# Patient Record
Sex: Female | Born: 1935 | Race: White | Hispanic: No | Smoking: Former smoker
Health system: Southern US, Community
[De-identification: ages and names within clinical notes are randomized; demographics above are authoritative.]

## PROBLEM LIST (undated history)

## (undated) DIAGNOSIS — E739 Lactose intolerance, unspecified: Secondary | ICD-10-CM

## (undated) DIAGNOSIS — Z860100 Personal history of colon polyps, unspecified: Secondary | ICD-10-CM

## (undated) DIAGNOSIS — I00 Rheumatic fever without heart involvement: Secondary | ICD-10-CM

## (undated) DIAGNOSIS — K589 Irritable bowel syndrome without diarrhea: Secondary | ICD-10-CM

## (undated) DIAGNOSIS — J8410 Pulmonary fibrosis, unspecified: Secondary | ICD-10-CM

## (undated) DIAGNOSIS — I4891 Unspecified atrial fibrillation: Secondary | ICD-10-CM

## (undated) DIAGNOSIS — E559 Vitamin D deficiency, unspecified: Secondary | ICD-10-CM

## (undated) DIAGNOSIS — E785 Hyperlipidemia, unspecified: Secondary | ICD-10-CM

## (undated) DIAGNOSIS — M1990 Unspecified osteoarthritis, unspecified site: Secondary | ICD-10-CM

## (undated) DIAGNOSIS — I10 Essential (primary) hypertension: Secondary | ICD-10-CM

## (undated) HISTORY — DX: Personal history of colonic polyps: Z86.010

## (undated) HISTORY — PX: TONSILLECTOMY: SUR1361

## (undated) HISTORY — DX: Lactose intolerance, unspecified: E73.9

## (undated) HISTORY — DX: Unspecified osteoarthritis, unspecified site: M19.90

## (undated) HISTORY — DX: Hyperlipidemia, unspecified: E78.5

## (undated) HISTORY — DX: Vitamin D deficiency, unspecified: E55.9

## (undated) HISTORY — PX: CATARACT EXTRACTION: SUR2

## (undated) HISTORY — DX: Irritable bowel syndrome, unspecified: K58.9

## (undated) HISTORY — PX: APPENDECTOMY: SHX54

## (undated) HISTORY — DX: Pulmonary fibrosis, unspecified: J84.10

## (undated) HISTORY — DX: Rheumatic fever without heart involvement: I00

## (undated) HISTORY — PX: OVARIAN CYST SURGERY: SHX726

## (undated) HISTORY — DX: Personal history of colon polyps, unspecified: Z86.0100

## (undated) HISTORY — DX: Essential (primary) hypertension: I10

## (undated) HISTORY — DX: Unspecified atrial fibrillation: I48.91

---

## 2009-11-02 ENCOUNTER — Encounter: Payer: Self-pay | Admitting: Cardiology

## 2009-11-07 ENCOUNTER — Encounter: Payer: Self-pay | Admitting: Cardiology

## 2009-11-24 ENCOUNTER — Encounter: Payer: Self-pay | Admitting: Cardiology

## 2009-12-28 ENCOUNTER — Encounter: Payer: Self-pay | Admitting: Cardiology

## 2009-12-28 ENCOUNTER — Ambulatory Visit: Payer: Self-pay | Admitting: Cardiology

## 2010-01-02 ENCOUNTER — Encounter: Payer: Self-pay | Admitting: Cardiology

## 2010-01-20 ENCOUNTER — Encounter: Payer: Self-pay | Admitting: Cardiology

## 2010-02-13 ENCOUNTER — Ambulatory Visit: Payer: Self-pay | Admitting: Cardiology

## 2010-02-13 DIAGNOSIS — I7789 Other specified disorders of arteries and arterioles: Secondary | ICD-10-CM

## 2010-02-13 DIAGNOSIS — R55 Syncope and collapse: Secondary | ICD-10-CM | POA: Insufficient documentation

## 2010-02-13 DIAGNOSIS — R002 Palpitations: Secondary | ICD-10-CM

## 2010-02-13 DIAGNOSIS — R0602 Shortness of breath: Secondary | ICD-10-CM | POA: Insufficient documentation

## 2010-02-13 DIAGNOSIS — E119 Type 2 diabetes mellitus without complications: Secondary | ICD-10-CM | POA: Insufficient documentation

## 2010-02-13 DIAGNOSIS — I10 Essential (primary) hypertension: Secondary | ICD-10-CM

## 2010-02-13 DIAGNOSIS — E278 Other specified disorders of adrenal gland: Secondary | ICD-10-CM | POA: Insufficient documentation

## 2010-02-13 DIAGNOSIS — R072 Precordial pain: Secondary | ICD-10-CM | POA: Insufficient documentation

## 2010-02-28 ENCOUNTER — Encounter: Payer: Self-pay | Admitting: Cardiology

## 2010-05-03 ENCOUNTER — Encounter: Payer: Self-pay | Admitting: Cardiology

## 2010-06-07 ENCOUNTER — Encounter: Payer: Self-pay | Admitting: Cardiology

## 2010-06-12 ENCOUNTER — Ambulatory Visit: Payer: Self-pay | Admitting: Cardiology

## 2010-06-13 ENCOUNTER — Telehealth (INDEPENDENT_AMBULATORY_CARE_PROVIDER_SITE_OTHER): Payer: Self-pay | Admitting: *Deleted

## 2010-09-26 NOTE — Assessment & Plan Note (Signed)
Summary: 4 MO FU PER OCT REMINDER   Visit Type:  Follow-up Primary Provider:  [name],MD  CC:  Dyspnea.  History of Present Illness: The patient returns for followup of her dyspnea. At the last visit I thought she had some mild diastolic dysfunction and very mild systolic dysfunction but did not fully explain her dyspnea. She also had crackles on lung exam. She has since seen Dr. Orson Aloe. Pulmonary function testing demonstrated only a mild diffusion abnormality. He thought her 6 minute walk test was actually reasonable though she apparently desaturated at the end. He did order a CT which I reviewed today. This demonstrates some bronchiectasis and apparent pulmonary fibrosis. The patient however has actually been feeling better. She's been doing exercises and slowly has improved. She is able to take a deep breath and has less cough weakness. She is not describing any chest pressure, neck or arm discomfort. She is not describing any palpitations, presyncope or syncope. She is not describing PND or orthopnea.   Preventive Screening-Counseling & Management  Alcohol-Tobacco     Smoking Status: quit     Year Quit: 1974  Current Medications (verified): 1)  Losartan Potassium 100 Mg Tabs (Losartan Potassium) .... Take 1 Tablet By Mouth Once A Day 2)  Metformin Hcl 500 Mg Tabs (Metformin Hcl) .... Take 1 Tablet By Mouth Twice A Day 3)  Hydrochlorothiazide 12.5 Mg Tabs (Hydrochlorothiazide) .... Take 1 Tablet By Mouth Once A Day 4)  Aspir-Low 81 Mg Tbec (Aspirin) .... Take 1 Tablet By Mouth Once A Day 5)  Crestor 10 Mg Tabs (Rosuvastatin Calcium) .... Take 1 Tablet By Mouth Once A Day 6)  Glucosamine 1500 Complex  Caps (Glucosamine-Chondroit-Vit C-Mn) .... Take 2 Tablet By Mouth Once A Day 7)  Coq10 30 Mg Caps (Coenzyme Q10) .... Take 1 Tablet By Mouth Once A Day 8)  Eq Chlortabs 4 Mg Tabs (Chlorpheniramine Maleate) .... Take 1 Tablet By Mouth Once A Day As Needed 9)  Amlodipine Besylate  2.5 Mg Tabs (Amlodipine Besylate) .... Take One Tablet By Mouth Daily 10)  Mucinex 600 Mg Xr12h-Tab (Guaifenesin) .... Take 1 Tablet By Mouth Once A Day 11)  Aleve 220 Mg Tabs (Naproxen Sodium) .... As Needed 12)  Selenium 200 Mcg Tabs (Selenium) .... Take One By Mouth Twice Weekly 13)  Ra Turmeric 500 Mg Caps (Turmeric) .... As Needed 14)  Magnesium 250 Mg Tabs (Magnesium) .... Take One By Mouth Weekly 15)  Super Cal-Mag-D [phone] Mg-Mg-Unit Tabs (Calcium-Magnesium-Vitamin D) .... Take 1 Tablet By Mouth Two Times A Day 16)  Rhodiola 300 Mg Caps (Rhodiola Rosea) .... Take One By Mouth Twice Weekly 17)  Vitamin D3 2000 Unit Tabs (Cholecalciferol) .... Take 1 Tablet By Mouth Once A Day  Allergies: 1)  ! * ?pain Medication  Comments:  Nurse/Medical Assistant: The patient's medication bottles and allergies were reviewed with the patient and were updated in the Medication and Allergy Lists.  Past History:  Past Medical History: Reviewed history from 02/13/2010 and no changes required. Presyncopal episodes DM x 12 years HTN x years Hyperlipidemia x 10 years VIT D Insuff s/p replacement OA with lumbar stenosis Lactose intolerance Rheumatic fever Mildly reduced ejection fraction processes echocardiography 12/2009)  Past Surgical History: Reviewed history from 02/13/2010 and no changes required. Ovarian cyst resected Appendectomy Tonsillectomy Cataract surgery  Review of Systems       As stated in the HPI and negative for all other systems.     Vital Signs:  Patient profile:  77 year old female Height:      65 inches Weight:      202 pounds Pulse rate:   83 / minute BP sitting:   129 / 87  (left arm) Cuff size:   large  Vitals Entered By: Carlye Grippe (June 12, 2010 3:45 PM)  Physical Exam  General:  Well developed, well nourished, in no acute distress. Head:  normocephalic and atraumatic Eyes:  PERRLA/EOM intact; conjunctiva and lids normal. Neck:  Neck  supple, no JVD. No masses, thyromegaly or abnormal cervical nodes. Lungs:  Bilateral basilar crackles one third of the way up Abdomen:  Bowel sounds positive; abdomen soft and non-tender without masses, organomegaly, or hernias noted. No hepatosplenomegaly. Msk:  Back normal, normal gait. Muscle strength and tone normal. Extremities:  Bilateral varicose veins, with mild bilateral edema Neurologic:  Alert and oriented x 3. Skin:  Intact without lesions or rashes. Cervical Nodes:  no significant adenopathy Psych:  Normal affect.   Detailed Cardiovascular Exam  Neck    Carotids: Carotids full and equal bilaterally soft bilateral bruits.      Neck Veins: Normal, no JVD.    Heart    Inspection: no deformities or lifts noted.      Palpation: normal PMI with no thrills palpable.      Auscultation: irregular rate and rhythm, S1, S2 without murmurs, rubs, gallops, or clicks.    Vascular    Abdominal Aorta: no palpable masses, pulsations, or audible bruits.      Femoral Pulses: normal femoral pulses bilaterally.      Pedal Pulses: normal pedal pulses bilaterally.  absent right dorsalis pedis pulse and absent left dorsalis pedis pulse.      Radial Pulses: normal radial pulses bilaterally.      Peripheral Circulation: no clubbing, cyanosis, or edema noted with normal capillary refill.     Impression & Recommendations:  Problem # 1:  DYSPNEA (ICD-786.05) The patient probably has some component of her dyspnea related to mild diastolic and systolic dysfunction. We discussed conservative therapies for management of his blood pressure control salt and fluid restriction. She does have dry crackles on exam and pulmonary fibrosis on her CT. I have moved up her appointment to see the pulmonologist in followup for followup of this CT finding. She will continue her rehabilitation/exercise as she is improving.  Problem # 2:  HYPERTENSION (ICD-401.9) Her blood pressure is controlled. She will continue the  meds as listed.  Problem # 3:  PRECORDIAL PAIN (ICD-786.51) At this point no further cardiovascular testing is suggested.C. with risk reduction and meds as listed.  Patient Instructions: 1)  Your physician wants you to follow-up in: 1 year. You will receive a reminder letter in the mail one-two months in advance. If you don't receive a letter, please call our office to schedule the follow-up appointment. 2)  Your physician recommends that you continue on your current medications as directed. Please refer to the Current Medication list given to you today.

## 2010-09-26 NOTE — Assessment & Plan Note (Signed)
Summary: NP- NONDILATED CARDIOMYOPATHY   Visit Type:  Initial Consult Primary Provider:  Youlanda Roys  CC:  Abnormal Nuclear Test.  History of Present Illness: The patient presents for evaluation of an abnormal echocardiogram. The patient has had dyspnea. She was living in [place] in 2009 and had a workup for evaluation of this and pain between her shoulder blades. A stress perfusion study then demonstrated no ischemia or infarct. I reviewed this. She has moved down here and has continued to have slowly progressive dyspnea. Should get short of breath if she has to walk quickly on level ground. She can do household chores" her own pace" and not be dyspneic. She does not describe PND or orthopnea. She has had 2 episodes of discomfort similar to that which she had 2 years ago. This is between her shoulder blades. It is a dull cramping feeling that comes on at night at rest. It does not radiate. There are no associated symptoms. It is moderate in intensity and comes on spontaneously and goes away the same way. She cannot bring this on.  For evaluation of this her primary physician did water an echocardiogram which suggested that her ejection fraction was 45-50%. There was mild aortic valve sclerosis. There was mild aortic regurgitation. There was a mildly elevated descending aorta. Stress perfusion imaging on 54 however suggested the EES to be about 55% with questionable small apical defect consistent with apical thinning versus ischemia.  New Orders:     1)  EKG w/ Interpretation (93000)     2)  T-BNP  (B Natriuretic Peptide) ([id number])   Preventive Screening-Counseling & Management  Alcohol-Tobacco     Smoking Status: quit     Year Quit: 1970  Current Medications (verified): 1)  Atacand 32 Mg Tabs (Candesartan Cilexetil) .... Take 1 Tablet By Mouth Once A Day 2)  Metformin Hcl 500 Mg Tabs (Metformin Hcl) .... Take 1 Tablet By Mouth Twice A Day 3)  Hydrochlorothiazide 12.5 Mg Tabs  (Hydrochlorothiazide) .... Take 1 Tablet By Mouth Once A Day 4)  Aspir-Low 81 Mg Tbec (Aspirin) .... Take 1 Tablet By Mouth Once A Day 5)  Crestor 10 Mg Tabs (Rosuvastatin Calcium) .... Take 1 Tablet By Mouth Once A Day 6)  Glucosamine 1500 Complex  Caps (Glucosamine-Chondroit-Vit C-Mn) .... Take 2 Tablet By Mouth Once A Day 7)  Coq10 30 Mg Caps (Coenzyme Q10) .... Take 1 Tablet By Mouth Once A Day 8)  Eq Chlortabs 4 Mg Tabs (Chlorpheniramine Maleate) .... Take 1 Tablet By Mouth Once A Day 9)  Multivitamins  Tabs (Multiple Vitamin) .... Take 1 Tablet By Mouth Once A Day  Allergies (verified): 1)  ! * ?pain Medication  Comments:  Nurse/Medical Assistant: The patient's medication bottles and allergies were reviewed with the patient and were updated in the Medication and Allergy Lists.  Past History:  Past Medical History: Presyncopal episodes DM x 12 years HTN x years Hyperlipidemia x 10 years VIT D Insuff s/p replacement OA with lumbar stenosis Lactose intolerance Rheumatic fever Mildly reduced ejection fraction processes echocardiography 12/2009)  Past Surgical History: Ovarian cyst resected Appendectomy Tonsillectomy Cataract surgery  Family History: Father died CVA age 38, Htn Mother died age 24 breast CA Brothers and sisters with "Heart Problems"  Social History: Widow x 2  2 Children Quit tobacco remotely briefly Smoking Status:  quit  Vital Signs:  Patient profile:   77 year old female Height:      65 inches Weight:  200 pounds BMI:     33.40 Pulse rate:   66 / minute BP sitting:   156 / 97  (left arm) Cuff size:   regular  Vitals Entered By: Carlye Grippe (February 13, 2010 12:56 PM)  Nutrition Counseling: Patient's BMI is greater than 25 and therefore counseled on weight management options. CC: Abnormal Nuclear Test   Physical Exam  General:  Well developed, well nourished, in no acute distress. Head:  normocephalic and atraumatic Eyes:   PERRLA/EOM intact; conjunctiva and lids normal. Mouth:  Upper dentures. Oral mucosa normal. Neck:  Neck supple, no JVD. No masses, thyromegaly or abnormal cervical nodes. Chest Wall:  no deformities or breast masses noted Lungs:  Clear bilaterally to auscultation and percussion. Abdomen:  Bowel sounds positive; abdomen soft and non-tender without masses, organomegaly, or hernias noted. No hepatosplenomegaly. Msk:  Back normal, normal gait. Muscle strength and tone normal. Extremities:  Bilateral varicose veins Neurologic:  Alert and oriented x 3. Skin:  Intact without lesions or rashes. Cervical Nodes:  no significant adenopathy Axillary Nodes:  no significant adenopathy Inguinal Nodes:  no significant adenopathy Psych:  Normal affect.   Detailed Cardiovascular Exam  Neck    Carotids: Carotids full and equal bilaterally soft bilateral bruits.      Neck Veins: Normal, no JVD.    Heart    Inspection: no deformities or lifts noted.      Palpation: normal PMI with no thrills palpable.      Auscultation: irregular rate and rhythm, S1, S2 without murmurs, rubs, gallops, or clicks.    Vascular    Abdominal Aorta: no palpable masses, pulsations, or audible bruits.      Femoral Pulses: normal femoral pulses bilaterally.      Pedal Pulses: normal pedal pulses bilaterally.  absent right dorsalis pedis pulse and absent left dorsalis pedis pulse.      Radial Pulses: normal radial pulses bilaterally.      Peripheral Circulation: no clubbing, cyanosis, or edema noted with normal capillary refill.     EKG  Procedure date:  02/13/2010  Findings:      sinus rhythm, rate 64, axis within normal limits, , intervals within normal limits, premature atrial contractions, poor anterior R-wave progression  Impression & Recommendations:  Problem # 1:  DYSPNEA (ICD-786.05)  The patient has slowly progressive dyspnea. I suspect that her ejection fraction is low normal based on the 2 studies reviewed.  It's unclear whether this could be contributing to her symptoms. In this situation BNP testing would be helpful. If this is normal I would suggest a pulmonary workup rather than further cardiac workup. I would suggest a followup echocardiogram in one year to reassess the mild abnormalities demonstrated.  Orders: T-BNP  (B Natriuretic Peptide) ([id number])  Problem # 2:  HYPERTENSION (ICD-401.9) Her blood pressure is not controlled. She reports it is in the 150s at home.  I will add amlodipine 2.5 mg daily.  Problem # 3:  PALPITATIONS (ICD-785.1)  She has PACs but I don't suspect significant dysrhythmia. No further evaluation is warranted.  Orders: EKG w/ Interpretation (93000)  Patient Instructions: 1)  Your physician recommends that you go to the [facility] for lab work. 2)  Start amlodipine (Norvasc) 2.5mg  by mouth once daily. 3)  Your physician wants you to follow-up in: 4 months. You will receive a reminder letter in the mail one-two months in advance. If you don't receive a letter, please call our office to schedule the follow-up appointment. Prescriptions: AMLODIPINE  BESYLATE 2.5 MG TABS (AMLODIPINE BESYLATE) Take one tablet by mouth daily  #30 x 6   Entered by:   Cyril Loosen, RN, BSN   Authorized by:   Rollene Rotunda, MD, [facility]   Signed by:   Cyril Loosen, RN, BSN on 02/13/2010   Method used:   Electronically to        [facility]* (retail)       [address]       [place], [place]  [address]       Ph: [phone]       Fax: [phone]   RxID:   [id number]   Handout requested.  I have reviewed and approved all prescriptions at the time of this visit. Rollene Rotunda, MD, [facility]  February 13, 2010 1:52 PM

## 2010-09-26 NOTE — Letter (Signed)
Summary: [facility]  - OFFICE NOTE  [facility]  - OFFICE NOTE   Imported By: Claudette Laws 01/09/2010 16:30:56  _____________________________________________________________________  External Attachment:    Type:   Image     Comment:   External Document

## 2010-09-26 NOTE — Progress Notes (Signed)
Summary: Dr. Orson Aloe appt  ---- Converted from flag ---- ---- 06/12/2010 4:35 PM, Cyril Loosen, RN, BSN wrote: needs appt w/[facility] sooner. left message for them 10/17 1634 ------------------------------  Dr. Orson Aloe can see pt on 11/1 at 11:30am.   Left message to notify pt of appt.

## 2010-12-27 ENCOUNTER — Encounter: Payer: Self-pay | Admitting: Vascular Surgery

## 2012-03-11 ENCOUNTER — Encounter: Payer: Self-pay | Admitting: Cardiology

## 2012-03-12 ENCOUNTER — Encounter: Payer: Self-pay | Admitting: Internal Medicine

## 2012-03-12 LAB — COMPREHENSIVE METABOLIC PANEL
ALT: 9 U/L (ref 7–35)
Albumin/Globulin Ratio: 1.2
Albumin: 3.8
BUN: 17 mg/dL (ref 4–21)
Calcium: 9.1 mg/dL
Globulin, Total: 3.3
Glucose: 121
Sodium: 139 mmol/L (ref 137–147)
TSH: 5.92 u[IU]/mL — AB (ref 0.41–5.90)
Total Bilirubin: 0.7 mg/dL
Total Protein: 7.1 g/dL

## 2012-03-14 ENCOUNTER — Encounter: Payer: Self-pay | Admitting: Cardiology

## 2012-03-25 ENCOUNTER — Encounter: Payer: Self-pay | Admitting: Urgent Care

## 2012-03-25 ENCOUNTER — Ambulatory Visit (INDEPENDENT_AMBULATORY_CARE_PROVIDER_SITE_OTHER): Payer: Medicare PPO | Admitting: Urgent Care

## 2012-03-25 ENCOUNTER — Other Ambulatory Visit: Payer: Self-pay | Admitting: Gastroenterology

## 2012-03-25 VITALS — BP 156/91 | HR 72 | Temp 98.0°F | Ht 63.0 in | Wt 188.6 lb

## 2012-03-25 DIAGNOSIS — R197 Diarrhea, unspecified: Secondary | ICD-10-CM | POA: Insufficient documentation

## 2012-03-25 DIAGNOSIS — Z86010 Personal history of colonic polyps: Secondary | ICD-10-CM

## 2012-03-25 DIAGNOSIS — K529 Noninfective gastroenteritis and colitis, unspecified: Secondary | ICD-10-CM

## 2012-03-25 MED ORDER — PEG 3350-KCL-NA BICARB-NACL 420 G PO SOLR: ORAL | Status: AC

## 2012-03-25 NOTE — Patient Instructions (Addendum)
Colonoscopy w/ Dr Darrick Penna Stools to lab asap ALIGN daily

## 2012-03-25 NOTE — Progress Notes (Signed)
Referring Provider: Dr Kathlene November Primary Care Physician:  Juliette Alcide, MD Primary Gastroenterologist:  Dr. Jonette Eva  Chief Complaint  Patient presents with  . Irritable Bowel Syndrome    Diarrhea    HPI:  Samantha Ryan is a 78 y.o. female here as a referral from Dr. Kathlene November for IBS symptoms.  Pt states she was diagnosed w/ IBS after colonoscopy in 1999. She has diarrhea predominant symptoms.  Her symptoms have been worse over the past 3 months.  She has episodes of diarrhea 1-2 times per week.  C/o gas & bloating with cramps .  C/o postprandial urgency.  She starts to have a normal BM, which is then followed by multiple loose stools. At times, she is up 1/2 the night wiith diarrhea.  C/o nausea & cramps that are relieved w/ defecation.  Last colonoscopy 2005  In [place], [place] & she believes she had polyps.  She has tried pepto but doesn't like it because it causes constipation.  She takes an antidiarrheal green pill from [employer] & it seems to help.  Denies rectal bleeding or melena.  Weight is stable.  Appetite is good.  Denies fever, chills, new medications or recent antibiotics or foreign travel.  She did have a change w/ thyroid meds after abnormal TSH.  CMP glucose 121, otherwise normal (03/11/12) Hgb A1C 6.9 TSH 5.920  Past Medical History  Diagnosis Date  . Pre-syncope   . Diabetes mellitus   . Hypertension   . Hyperlipidemia   . Vitamin D insufficiency   . OA (osteoarthritis)   . Lactose intolerance   . Rheumatic fever   . Pulmonary fibrosis   . IBS (irritable bowel syndrome)     Past Surgical History  Procedure Date  . Ovarian cyst surgery   . Appendectomy   . Tonsillectomy   . Cataract extraction     Current Outpatient Prescriptions  Medication Sig Dispense Refill  . amLODipine (NORVASC) 2.5 MG tablet Take 2.5 mg by mouth daily.      Marland Kitchen aspirin 81 MG tablet Take 81 mg by mouth daily.      . chlorpheniramine (CHLOR-TRIMETON) 4 MG tablet Take 4 mg by mouth 2 (two)  times daily as needed.      . Cholecalciferol (VITAMIN D-3 PO) Take 1,000 Units by mouth daily.       . Coenzyme Q10 (COQ10 PO) Take by mouth.      . fish oil-omega-3 fatty acids 1000 MG capsule Take 2 g by mouth daily.      Marland Kitchen guaiFENesin (MUCINEX) 600 MG 12 hr tablet Take 1,200 mg by mouth daily.      . hydrochlorothiazide (HYDRODIURIL) 25 MG tablet Take 25 mg by mouth daily.       Marland Kitchen levothyroxine (SYNTHROID, LEVOTHROID) 25 MCG tablet Take 25 mcg by mouth daily.       Marland Kitchen losartan (COZAAR) 100 MG tablet Take 100 mg by mouth daily.      . magnesium oxide (MAG-OX) 400 MG tablet Take 400 mg by mouth daily.      . metFORMIN (GLUCOPHAGE) 500 MG tablet Take 500 mg by mouth 2 (two) times daily with a meal.      . psyllium (METAMUCIL) 58.6 % powder Take 1 packet by mouth daily.      . Red Yeast Rice 600 MG TABS Take 600 mg by mouth daily.      Marland Kitchen zolpidem (AMBIEN) 5 MG tablet Take 5 mg by mouth at bedtime as needed.       Marland Kitchen  polyethylene glycol-electrolytes (TRILYTE) 420 G solution Use as directed Also buy 1 fleet enema & 4 dulcolax tablets to use as directed  4000 mL  0  . Selenium 200 MCG CAPS Take by mouth.      . Turmeric 500 MG CAPS Take by mouth.        Allergies as of 03/25/2012  . (No Known Allergies)    Family History:There is no known family history of colorectal carcinoma , liver disease, or inflammatory bowel disease.  Problem Relation Age of Onset  . Stroke Father 41    Cause of Death  . Hypertension Father   . Breast cancer Mother 25    Cause of death    History   Social History  . Marital Status: Widowed    Spouse Name: N/A    Number of Children: 2  . Years of Education: N/A   Occupational History  . Not on file.   Social History Main Topics  . Smoking status: Former Games developer  . Smokeless tobacco: Not on file  . Alcohol Use: No  . Drug Use: No  . Sexually Active: Not on file   Review of Systems: Gen: Denies any fever, chills, sweats, anorexia, fatigue, weakness,  malaise, weight loss, and sleep disorder CV: Denies chest pain, angina, palpitations, syncope, orthopnea, PND, peripheral edema, and claudication. Resp: Denies dyspnea at rest, dyspnea with exercise, cough, sputum, wheezing, coughing up blood, and pleurisy. GI: Denies vomiting blood, jaundice, and fecal incontinence.   Denies dysphagia or odynophagia. GU : Denies urinary burning, blood in urine, urinary frequency, urinary hesitancy, nocturnal urination, and urinary incontinence. MS: Denies joint pain, limitation of movement, and swelling, stiffness, low back pain, extremity pain. Denies muscle weakness, cramps, atrophy.  Derm: Denies rash, itching, dry skin, hives, moles, warts, or unhealing ulcers.  Psych: Denies depression, anxiety, memory loss, suicidal ideation, hallucinations, paranoia, and confusion. Heme: Denies bruising, bleeding, and enlarged lymph nodes. Neuro:  Denies any headaches, dizziness, paresthesias. Endo:  Denies any problems with DM, thyroid, adrenal function.  Physical Exam: BP 156/91  Pulse 72  Temp 98 F (36.7 C) (Temporal)  Ht 5\' 3"  (1.6 m)  Wt 188 lb 9.6 oz (85.548 kg)  BMI 33.41 kg/m2 General:   Alert,  Well-developed, well-nourished, pleasant and cooperative in NAD Head:  Normocephalic and atraumatic. Eyes:  Sclera clear, no icterus.   Conjunctiva pink. Ears:  Normal auditory acuity. Nose:  No deformity, discharge, or lesions. Mouth:  No deformity or lesions,oropharynx pink & moist. Neck:  Supple; no masses or thyromegaly. Lungs:  Clear throughout to auscultation.   No wheezes, crackles, or rhonchi. No acute distress. Heart:  Regular rate and rhythm; no murmurs, clicks, rubs,  or gallops. Abdomen:  Normal bowel sounds.  No bruits.  Soft, non-tender and non-distended without masses, hepatosplenomegaly or hernias noted.  No guarding or rebound tenderness.   Rectal:  Deferred. Msk:  Symmetrical without gross deformities. Normal posture. Pulses:  Normal pulses  noted. Extremities:  No edema. Neurologic:  Alert and oriented x4;  grossly normal neurologically. Skin:  Intact without significant lesions or rashes. Lymph Nodes:  No significant cervical adenopathy. Psych:  Alert and cooperative. Normal mood and affect.'

## 2012-03-26 ENCOUNTER — Encounter: Payer: Self-pay | Admitting: Urgent Care

## 2012-03-26 DIAGNOSIS — Z86010 Personal history of colonic polyps: Secondary | ICD-10-CM | POA: Insufficient documentation

## 2012-03-26 NOTE — Progress Notes (Signed)
Faxed to PCP

## 2012-03-26 NOTE — Assessment & Plan Note (Addendum)
Samantha Ryan is a pleasant 78 y.o. female with hx IBS-D.  Over past 3 months, worsening diarrhea, especially nocturnal, with urgency & abdominal pain.  Differentials include microscopic colitis, colorectal ca, diabetic enteropathy or SBBO, side effect from change in synthroid, acute infectious process or IBS-flare.  Ileocolonoscopy w/ random biopsies if needed with Dr Darrick Penna.  I have discussed risks & benefits which include, but are not limited to, bleeding, infection, perforation & drug reaction.  The patient agrees with this plan & written consent will be obtained.    Full set stool studies ALIGN daily

## 2012-03-31 ENCOUNTER — Encounter: Payer: Self-pay | Admitting: Cardiology

## 2012-03-31 ENCOUNTER — Ambulatory Visit (INDEPENDENT_AMBULATORY_CARE_PROVIDER_SITE_OTHER): Payer: Medicare PPO | Admitting: Cardiology

## 2012-03-31 VITALS — BP 150/90 | HR 74 | Resp 16 | Ht 63.0 in | Wt 186.0 lb

## 2012-03-31 DIAGNOSIS — I4891 Unspecified atrial fibrillation: Secondary | ICD-10-CM

## 2012-03-31 MED ORDER — AMLODIPINE BESYLATE 5 MG PO TABS: 5.0000 mg | ORAL_TABLET | Freq: Every day | ORAL | Status: AC

## 2012-03-31 NOTE — Patient Instructions (Addendum)
Atrial Fibrillation Your caregiver has diagnosed you with atrial fibrillation (AFib). The heart normally beats very regularly; AFib is a type of irregular heartbeat. The heart rate may be faster or slower than normal. This can prevent your heart from pumping as well as it should. AFib can be constant (chronic) or intermittent (paroxysmal). CAUSES  Atrial fibrillation may be caused by:  Heart disease, including heart attack, coronary artery disease, heart failure, diseases of the heart valves, and others.   Blood clot in the lungs (pulmonary embolism).   Pneumonia or other infections.   Chronic lung disease.   Thyroid disease.   Toxins. These include alcohol, some medications (such as decongestant medications or diet pills), and caffeine.  In some people, no cause for AFib can be found. This is referred to as Lone Atrial Fibrillation. SYMPTOMS   Palpitations or a fluttering in your chest.   A vague sense of chest discomfort.   Shortness of breath.   Sudden onset of lightheadedness or weakness.  Sometimes, the first sign of AFib can be a complication of the condition. This could be a stroke or heart failure. DIAGNOSIS  Your description of your condition may make your caregiver suspicious of atrial fibrillation. Your caregiver will examine your pulse to determine if fibrillation is present. An EKG (electrocardiogram) will confirm the diagnosis. Further testing may help determine what caused you to have atrial fibrillation. This may include chest x-ray, echocardiogram, blood tests, or CT scans. PREVENTION  If you have previously had atrial fibrillation, your caregiver may advise you to avoid substances known to cause the condition (such as stimulant medications, and possibly caffeine or alcohol). You may be advised to use medications to prevent recurrence. Proper treatment of any underlying condition is important to help prevent recurrence. PROGNOSIS  Atrial fibrillation does tend  to become a chronic condition over time. It can cause significant complications (see below). Atrial fibrillation is not usually immediately life-threatening, but it can shorten your life expectancy. This seems to be worse in women. If you have lone atrial fibrillation and are under 4 years old, the risk of complications is very low, and life expectancy is not shortened. RISKS AND COMPLICATIONS  Complications of atrial fibrillation can include stroke, chest pain, and heart failure. Your caregiver will recommend treatments for the atrial fibrillation, as well as for any underlying conditions, to help minimize risk of complications. TREATMENT  Treatment for AFib is divided into several categories:  Treatment of any underlying condition.   Converting you out of AFib into a regular (sinus) rhythm.   Controlling rapid heart rate.   Prevention of blood clots and stroke.  Medications and procedures are available to convert your atrial fibrillation to sinus rhythm. However, recent studies have shown that this may not offer you any advantage, and cardiac experts are continuing research and debate on this topic. More important is controlling your rapid heartbeat. The rapid heartbeat causes more symptoms, and places strain on your heart. Your caregiver will advise you on the use of medications that can control your heart rate. Atrial fibrillation is a strong stroke risk. You can lessen this risk by taking blood thinning medications such as Coumadin (warfarin), or sometimes aspirin. These medications need close monitoring by your caregiver. Over-medication can cause bleeding. Too little medication may not protect against stroke. HOME CARE INSTRUCTIONS   If your caregiver prescribed medicine to make your heartbeat more normally, take as directed.   If blood thinners were prescribed by your caregiver, take EXACTLY  as directed.   Perform blood tests EXACTLY as directed.   Quit smoking. Smoking increases  your cardiac and lung (pulmonary) risks.   DO NOT drink alcohol.   DO NOT drink caffeinated drinks (e.g. coffee, soda, chocolate, and leaf teas). You may drink decaffeinated coffee, soda or tea.   If you are overweight, you should choose a reduced calorie diet to lose weight. Please see a registered dietitian if you need more information about healthy weight loss. DO NOT USE DIET PILLS as they may aggravate heart problems.   If you have other heart problems that are causing AFib, you may need to eat a low salt, fat, and cholesterol diet. Your caregiver will tell you if this is necessary.   Exercise every day to improve your physical fitness. Stay active unless advised otherwise.   If your caregiver has given you a follow-up appointment, it is very important to keep that appointment. Not keeping the appointment could result in heart failure or stroke. If there is any problem keeping the appointment, you must call back to this facility for assistance.  SEEK MEDICAL CARE IF:  You notice a change in the rate, rhythm or strength of your heartbeat.   You develop an infection or any other change in your overall health status.  SEEK IMMEDIATE MEDICAL CARE IF:   You develop chest pain, abdominal pain, sweating, weakness or feel sick to your stomach (nausea).   You develop shortness of breath.   You develop swollen feet and ankles.   You develop dizziness, numbness, or weakness of your face or limbs, or any change in vision or speech.  MAKE SURE YOU:   Understand these instructions.   Will watch your condition.   Will get help right away if you are not doing well or get worse.  Document Released: 08/13/2005 Document Revised: 08/02/2011 Document Reviewed: 03/17/2008 [facility] Patient Information 2012 [employer], [place].  ================================================================================   Increase Amlodipine to 5mg  daily  Echo  24 hour holter monitor   Follow up after  above

## 2012-03-31 NOTE — Addendum Note (Signed)
Addended by: Waymon Budge on: 03/31/2012 04:14 PM   Modules accepted: Orders

## 2012-03-31 NOTE — Progress Notes (Signed)
HPI The patient called to be seen for evaluation of dizziness and lightheadedness. She said this has been happening for about a year. She thinks it is getting slightly slowly worse. She does not describe any syncope. She's not describing orthostasis. She says the dizzy episodes happen if she is seated or standing but not when she is lying down. She says things will get "woozy" and she will have to steady herself. She doesn't describe any palpitations. However, she has had noted significant heart rate variations typically on the lower and when she takes her blood pressure. She denies any chest pressure, neck or arm discomfort. She's had no PND or orthopnea. However, she does get more short of breath with activities such as vacuuming or water exercises that she used to get.   I did see this patient in 2011. She had a workup for dyspnea. In 2009 she had a stress test in [place]. This demonstrated no evidence of ischemia. An echocardiogram done in 2011 demonstrated an EF of 45-50%. However, followup stress perfusion study demonstrated the EF to be 55% with mild apical thinning.  Because of ongoing dyspnea she saw Dr.Henderson.  Pulmonary function testing was only mildly abnormal there was CT demonstrated some bronchiectasis and pulmonary fibrosis.  No Known Allergies  Current Outpatient Prescriptions  Medication Sig Dispense Refill  . amLODipine (NORVASC) 5 MG tablet Take 1 tablet (5 mg total) by mouth daily.  30 tablet  6  . aspirin 81 MG tablet Take 81 mg by mouth daily.      . chlorpheniramine (CHLOR-TRIMETON) 4 MG tablet Take 4 mg by mouth 2 (two) times daily as needed.      . Cholecalciferol (VITAMIN D-3 PO) Take 1,000 Units by mouth daily.       . Coenzyme Q10 (COQ10 PO) Take by mouth.      . fish oil-omega-3 fatty acids 1000 MG capsule Take 2 g by mouth daily.      . fluticasone (FLONASE) 50 MCG/ACT nasal spray Place 2 sprays into the nose daily.      Marland Kitchen guaiFENesin (MUCINEX) 600 MG 12 hr  tablet Take 1,200 mg by mouth daily.      . hydrochlorothiazide (HYDRODIURIL) 25 MG tablet Take 25 mg by mouth daily.       Marland Kitchen HYDROcodone-acetaminophen (VICODIN) 5-500 MG per tablet Take 1 tablet by mouth every 6 (six) hours as needed.      Marland Kitchen levothyroxine (SYNTHROID, LEVOTHROID) 25 MCG tablet Take 25 mcg by mouth daily.       Marland Kitchen losartan (COZAAR) 100 MG tablet Take 100 mg by mouth daily.      . magnesium oxide (MAG-OX) 400 MG tablet Take 400 mg by mouth daily.      . metFORMIN (GLUCOPHAGE) 500 MG tablet Take 500 mg by mouth 2 (two) times daily with a meal.      . psyllium (METAMUCIL) 58.6 % powder Take 1 packet by mouth daily.      . Red Yeast Rice 600 MG TABS Take 600 mg by mouth daily.      . Selenium 200 MCG CAPS Take by mouth.      . Turmeric 500 MG CAPS Take by mouth.      . zolpidem (AMBIEN) 5 MG tablet Take 5 mg by mouth at bedtime as needed.       Marland Kitchen DISCONTD: amLODipine (NORVASC) 2.5 MG tablet Take 2.5 mg by mouth daily.        Past Medical History  Diagnosis Date  . Diabetes mellitus   . Hypertension   . Hyperlipidemia   . Vitamin D insufficiency   . OA (osteoarthritis)   . Lactose intolerance   . Rheumatic fever   . Pulmonary fibrosis   . IBS (irritable bowel syndrome)   . History of colon polyps     Past Surgical History  Procedure Date  . Ovarian cyst surgery   . Appendectomy   . Tonsillectomy   . Cataract extraction     ROS:  As stated in the HPI and negative for all other systems.  PHYSICAL EXAM BP 150/90  Pulse 74  Resp 16  Ht 5\' 3"  (1.6 m)  Wt 186 lb (84.369 kg)  BMI 32.95 kg/m2 GENERAL:  Well appearing HEENT:  Pupils equal round and reactive, fundi not visualized, oral mucosa unremarkable NECK:  No jugular venous distention, waveform within normal limits, carotid upstroke brisk and symmetric, no bruits, no thyromegaly LYMPHATICS:  No cervical, inguinal adenopathy LUNGS:  Clear to auscultation bilaterally BACK:  No CVA tenderness CHEST:   Unremarkable HEART:  PMI not displaced or sustained,S1 and S2 within normal limits, no S3, no clicks, no rubs, no murmurs, irregular  ABD:  Flat, positive bowel sounds normal in frequency in pitch, no bruits, no rebound, no guarding, no midline pulsatile mass, no hepatomegaly, no splenomegaly EXT:  2 plus pulses throughout, no edema, no cyanosis no clubbing SKIN:  No rashes no nodules NEURO:  Cranial nerves II through XII grossly intact, motor grossly intact throughout PSYCH:  Cognitively intact, oriented to person place and time   EKG:  Atrial fibrillation, rate 74, leftward axis, poor anterior R wave progression, nonspecific T-wave flattening. 03/31/2012  ASSESSMENT AND PLAN  Atrial fibrillation:  This seems to be a new finding. It may or may not be contributing to her symptoms. I will start with a 24-hour Holter monitor and an echocardiogram. Samantha Ryan has a CHA2DS2 - VASc score of 5 with a risk of stroke of 6.7%.  She will need to be on a blood thinner. I will discuss this with her at the next appointment as she does have an upcoming colonoscopy and she would need to hold anticoagulation for this.  HTN:  Her blood pressure is slightly elevated and I will increase her Norvasc to 5 mg daily.  Dyspnea:  This seems to be chronic. Evaluation will be as above.  Diabetes:  Her hemoglobin A1c was 6.9. This is being followed by Samantha Alcide, MD

## 2012-04-05 LAB — FECAL LACTOFERRIN, QUANT: Lactoferrin: NEGATIVE

## 2012-04-07 NOTE — Progress Notes (Signed)
Quick Note:  Please let pt know stool studies normal. Keep colonoscopy as planned. [id number], MD  ______

## 2012-04-08 LAB — STOOL CULTURE

## 2012-04-08 NOTE — Progress Notes (Signed)
Quick Note:  LMOM to call. ______ 

## 2012-04-09 NOTE — Progress Notes (Signed)
Quick Note:  LMOM to call. Mailed a letter also of the results and info. ______

## 2012-04-09 NOTE — Progress Notes (Signed)
Pt returned call and was given the message of the normal stool studies and to keep colonoscopy as planned.

## 2012-04-10 ENCOUNTER — Other Ambulatory Visit: Payer: Self-pay

## 2012-04-10 ENCOUNTER — Other Ambulatory Visit (INDEPENDENT_AMBULATORY_CARE_PROVIDER_SITE_OTHER): Payer: Medicare HMO

## 2012-04-10 DIAGNOSIS — I4891 Unspecified atrial fibrillation: Secondary | ICD-10-CM

## 2012-04-14 ENCOUNTER — Telehealth: Payer: Self-pay | Admitting: *Deleted

## 2012-04-14 NOTE — Telephone Encounter (Signed)
Patient informed. 

## 2012-04-14 NOTE — Telephone Encounter (Signed)
Message copied by Eustace Moore on Mon Apr 14, 2012  3:59 PM ------      Message from: Eustace Moore      Created: Mon Apr 14, 2012  3:54 PM       If not on your log, send back to me and I will notify patient            ----- Message -----         From: Eustace Moore, LPN         Sent: 04/14/2012   8:21 AM           To: Eustace Moore, LPN                        ----- Message -----         From: Rocco Serene, RN         Sent: 04/14/2012   8:20 AM           To: Lesle Chris, LPN                        ----- Message -----         From: Rollene Rotunda, MD         Sent: 04/10/2012   9:21 PM           To: Rocco Serene, RN            EF OK.  No significant valvular abnormalities.

## 2012-04-24 NOTE — Progress Notes (Signed)
Doris, Please call & give pt Dr Evelina Dun recommendations below re: lactose free diet & she may pick up from office or we can mail. Thanks

## 2012-04-24 NOTE — Progress Notes (Signed)
PT NEEDS TO BE ON A DAIRY FREE DIET UNTIL TCS.  REVIEWED.    Lactose Free Diet Lactose is a carbohydrate that is found mainly in milk and milk products, as well as in foods with added milk or whey. Lactose must be digested by the enzyme in order to be used by the body. Lactose intolerance occurs when there is a shortage of lactase. When your body is not able to digest lactose, you may feel sick to your stomach (nausea), bloating, cramping, gas and diarrhea.  There are many dairy products that may be tolerated better than milk by some people:  The use of cultured dairy products such as yogurt, buttermilk, cottage cheese, and sweet acidophilus milk (Kefir) for lactase-deficient individuals is usually well tolerated. This is because the healthy bacteria help digest lactose.   Lactose-hydrolyzed milk (Lactaid) contains 40-90% less lactose than milk and may also be well tolerated.    SPECIAL NOTES  Lactose is a carbohydrates. The major food source is dairy products. Reading food labels is important. Many products contain lactose even when they are not made from milk. Look for the following words: whey, milk solids, dry milk solids, nonfat dry milk powder. Typical sources of lactose other than dairy products include breads, candies, cold cuts, prepared and processed foods, and commercial sauces and gravies.   All foods must be prepared without milk, cream, or other dairy foods.   Soy milk and lactose-free supplements (LACTASE) may be used as an alternative to milk.   FOOD GROUP ALLOWED/RECOMMENDED AVOID/USE SPARINGLY  BREADS / STARCHES 4 servings or more* Breads and rolls made without milk. [place], [place], or [place] bread. Breads and rolls that contain milk. Prepared mixes such as muffins, biscuits, waffles, pancakes. Sweet rolls, donuts, [place] toast (if made with milk or lactose).  Crackers: Soda crackers, graham crackers. Any crackers prepared without lactose. Zwieback crackers, corn  curls, or any that contain lactose.  Cereals: Cooked or dry cereals prepared without lactose (read labels). Cooked or dry cereals prepared with lactose (read labels). Total, Cocoa Krispies. Special K.  Potatoes / Pasta / Rice: Any prepared without milk or lactose. Popcorn. Instant potatoes, frozen [place] fries, scalloped or au gratin potatoes.  VEGETABLES 2 servings or more Fresh, frozen, and canned vegetables. Creamed or breaded vegetables. Vegetables in a cheese sauce or with lactose-containing margarines.  FRUIT 2 servings or more All fresh, canned, or frozen fruits that are not processed with lactose. Any canned or frozen fruits processed with lactose.  MEAT & SUBSTITUTES 2 servings or more (4 to 6 oz. total per day) Plain beef, chicken, fish, [place], lamb, veal, pork, or ham. Kosher prepared meat products. Strained or junior meats that do not contain milk. Eggs, soy meat substitutes, nuts. Scrambled eggs, omelets, and souffles that contain milk. Creamed or breaded meat, fish, or fowl. Sausage products such as wieners, liver sausage, or cold cuts that contain milk solids. Cheese, cottage cheese, or cheese spreads.  MILK None. (See "BEVERAGES" for milk substitutes. See "DESSERTS" for ice cream and frozen desserts.) Milk (whole, 2%, skim, or chocolate). Evaporated, powdered, or condensed milk; malted milk.  SOUPS & COMBINATION FOODS Bouillon, broth, vegetable soups, clear soups, consomms. Homemade soups made with allowed ingredients. Combination or prepared foods that do not contain milk or milk products (read labels). Cream soups, chowders, commercially prepared soups containing lactose. Macaroni and cheese, pizza. Combination or prepared foods that contain milk or milk products.  DESSERTS & SWEETS In moderation Water and fruit ices; gelatin; angel  food cake. Homemade cookies, pies, or cakes made from allowed ingredients. Pudding (if made with water or a milk substitute). Lactose-free tofu  desserts. Sugar, honey, corn syrup, jam, jelly; marmalade; molasses (beet sugar); Pure sugar candy; marshmallows. Ice cream, ice milk, sherbet, custard, pudding, frozen yogurt. Commercial cake and cookie mixes. Desserts that contain chocolate. Pie crust made with milk-containing margarine; reduced-calorie desserts made with a sugar substitute that contains lactose. Toffee, peppermint, butterscotch, chocolate, caramels.  FATS & OILS In moderation Butter (as tolerated; contains very small amounts of lactose). Margarines and dressings that do not contain milk, Vegetable oils, shortening, Miracle Whip, mayonnaise, nondairy cream & whipped toppings without lactose or milk solids added (examples: Coffee Rich, Carnation Coffeemate, Rich's Whipped Topping, PolyRich). Tomasa Blase. Margarines and salad dressings containing milk; cream, cream cheese; peanut butter with added milk solids, sour cream, chip dips, made with sour cream.  BEVERAGES Carbonated drinks; tea; coffee and freeze-dried coffee; some instant coffees (check labels). Fruit drinks; fruit and vegetable juice; Rice or Soy milk. Ovaltine, hot chocolate. Some cocoas; some instant coffees; instant iced teas; powdered fruit drinks (read labels).   CONDIMENTS / MISCELLANEOUS Soy sauce, carob powder, olives, gravy made with water, baker's cocoa, pickles, pure seasonings and spices, wine, pure monosodium glutamate, catsup, mustard. Some chewing gums, chocolate, some cocoas. Certain antibiotics and vitamin / mineral preparations. Spice blends if they contain milk products. MSG extender. Artificial sweeteners that contain lactose such as Equal (Nutra-Sweet) and Sweet 'n Low. Some nondairy creamers (read labels).   SAMPLE MENU*  Breakfast   Orange Juice.  Banana.   Bran flakes.   Nondairy Creamer.  Vienna Bread (toasted).   Butter or milk-free margarine.   Coffee or tea.    Noon Meal   Chicken Breast.  Rice.   Green beans.   Butter or  milk-free margarine.  Fresh melon.   Coffee or tea.    Evening Meal   Roast Beef.  Baked potato.   Butter or milk-free margarine.   Broccoli.   Lettuce salad with vinegar and oil dressing.  [employer].   Coffee or tea.

## 2012-04-24 NOTE — Progress Notes (Signed)
Called and informed pt.   She is aware that diet is in the mail.

## 2012-04-24 NOTE — Progress Notes (Signed)
LMOM for pt to call. Also, mailed the diet with a note to follow until colonoscopy.

## 2012-04-29 ENCOUNTER — Encounter (HOSPITAL_COMMUNITY): Payer: Self-pay | Admitting: Pharmacy Technician

## 2012-05-05 ENCOUNTER — Ambulatory Visit: Payer: Medicare PPO | Admitting: Cardiology

## 2012-05-12 ENCOUNTER — Encounter: Payer: Self-pay | Admitting: Cardiology

## 2012-05-12 ENCOUNTER — Ambulatory Visit (INDEPENDENT_AMBULATORY_CARE_PROVIDER_SITE_OTHER): Payer: Medicare HMO | Admitting: Cardiology

## 2012-05-12 ENCOUNTER — Telehealth: Payer: Self-pay

## 2012-05-12 VITALS — BP 127/82 | HR 62 | Ht 63.0 in | Wt 188.8 lb

## 2012-05-12 DIAGNOSIS — I4891 Unspecified atrial fibrillation: Secondary | ICD-10-CM

## 2012-05-12 MED ORDER — RIVAROXABAN 20 MG PO TABS: 20.0000 mg | ORAL_TABLET | Freq: Every day | ORAL | Status: DC

## 2012-05-12 NOTE — Progress Notes (Signed)
HPI  The patient presents for follow up of dyspnea.  I saw her recently.  I first saw her in 2011. She had a workup for dyspnea. In 2009 she had a stress test in [place]. This demonstrated no evidence of ischemia. An echocardiogram done in 2011 demonstrated an EF of 45-50%. However, followup stress perfusion study demonstrated the EF to be 55% with mild apical thinning.  Because of ongoing dyspnea she saw Dr.Henderson.  Pulmonary function testing was only mildly abnormal there was CT demonstrated some bronchiectasis and pulmonary fibrosis.  At the last appointment I ordered an echo which demonstrated preserved systolic ejection fraction with very mild aortic valve stenosis.  Holter demonstrated good control of her atrial fibrillation.  Since that visit at which time I increased her Norvasc she thinks she is doing better. She does describe some coughing when she drinks cold fluids. However, she thinks overall her breathing is better. She's not having any PND or orthopnea. She's not had any palpitations, presyncope or syncope. She has had no chest pressure, neck or arm discomfort. She has had no weight gain or edema.  Allergies  Allergen Reactions  . Lactose Intolerance (Gi)     Current Outpatient Prescriptions  Medication Sig Dispense Refill  . amLODipine (NORVASC) 5 MG tablet Take 1 tablet (5 mg total) by mouth daily.  30 tablet  6  . aspirin 81 MG tablet Take 162 mg by mouth daily.       . chlorpheniramine (CHLOR-TRIMETON) 4 MG tablet Take 4 mg by mouth 2 (two) times daily as needed. For allergies      . Cholecalciferol (VITAMIN D-3 PO) Take 1,000 Units by mouth daily.       . Coenzyme Q10 (COQ10 PO) Take 1 tablet by mouth daily.       . fish oil-omega-3 fatty acids 1000 MG capsule Take 1 g by mouth daily.       . fluticasone (FLONASE) 50 MCG/ACT nasal spray Place 2 sprays into the nose daily as needed. For allergies      . guaiFENesin (MUCINEX) 600 MG 12 hr tablet Take 1,200 mg by mouth  daily as needed. For allergies      . hydrochlorothiazide (HYDRODIURIL) 25 MG tablet Take 25 mg by mouth daily.       Marland Kitchen HYDROcodone-acetaminophen (VICODIN) 5-500 MG per tablet Take 0.5 tablets by mouth every 6 (six) hours as needed. For pain      . levothyroxine (SYNTHROID, LEVOTHROID) 25 MCG tablet Take 25 mcg by mouth daily.       Marland Kitchen losartan (COZAAR) 100 MG tablet Take 100 mg by mouth daily.      . magnesium oxide (MAG-OX) 400 MG tablet Take 400 mg by mouth every other day.       . metFORMIN (GLUCOPHAGE) 500 MG tablet Take 500 mg by mouth daily.       . psyllium (METAMUCIL) 58.6 % powder Take 1 packet by mouth daily.      . Red Yeast Rice 600 MG TABS Take 600 mg by mouth daily.      . TRILYTE 420 G solution Take 4,000 mLs by mouth once.       . Turmeric 500 MG CAPS Take 1 tablet by mouth daily.       Marland Kitchen zolpidem (AMBIEN) 5 MG tablet Take 5 mg by mouth at bedtime as needed. For sleep        Past Medical History  Diagnosis Date  . Diabetes mellitus   .  Hypertension   . Hyperlipidemia   . Vitamin D insufficiency   . OA (osteoarthritis)   . Lactose intolerance   . Rheumatic fever   . Pulmonary fibrosis   . IBS (irritable bowel syndrome)   . History of colon polyps     Past Surgical History  Procedure Date  . Ovarian cyst surgery   . Appendectomy   . Tonsillectomy   . Cataract extraction     ROS:  As stated in the HPI and negative for all other systems.  PHYSICAL EXAM BP 127/82  Pulse 62  Ht 5\' 3"  (1.6 m)  Wt 188 lb 12.8 oz (85.639 kg)  BMI 33.44 kg/m2 GENERAL:  Well appearing HEENT:  Pupils equal round and reactive, fundi not visualized, oral mucosa unremarkable NECK:  No jugular venous distention, waveform within normal limits, carotid upstroke brisk and symmetric, no bruits, no thyromegaly LYMPHATICS:  No cervical, inguinal adenopathy LUNGS:  Clear to auscultation bilaterally BACK:  No CVA tenderness CHEST:  Unremarkable HEART:  PMI not displaced or sustained,S1 and  S2 within normal limits, no S3, no clicks, no rubs, no murmurs, irregular  ABD:  Flat, positive bowel sounds normal in frequency in pitch, no bruits, no rebound, no guarding, no midline pulsatile mass, no hepatomegaly, no splenomegaly EXT:  2 plus pulses throughout, no edema, no cyanosis no clubbing SKIN:  No rashes no nodules NEURO:  Cranial nerves II through XII grossly intact, motor grossly intact throughout PSYCH:  Cognitively intact, oriented to person place and time  EKG:  Atrial fibrillation, rate 74, leftward axis, poor anterior R wave progression, nonspecific T-wave flattening. 05/12/2012  ASSESSMENT AND PLAN  Atrial fibrillation:  This seems to be a new finding. It may or may not be contributing to her symptoms. I will start with a 24-hour Holter monitor and an echocardiogram. Samantha Ryan has a CHA2DS2 - VASc score of 5 with a risk of stroke of 6.7%.  She will need to be on a blood thinner. I reviewed this at length with her today including the risks and benefits and potential risk reduction. She will start Xarelto after her upcoming colonoscopy. She is instructed to stop her aspirin.   HTN:  Her blood pressure is better controlled after her Norvasc was increased at the last visit. No change in therapy is indicated.  Dyspnea:  This seems to be chronic and improved. No change in therapy is indicated.  Diabetes:  This is being followed by Samantha Alcide, MD

## 2012-05-12 NOTE — Patient Instructions (Addendum)
Stop Aspirin.  Start Xarelto 20mg  daily with your evening meal AFTER your colonoscopy.  Your physician wants you to follow-up in: 3 months with Dr. Antoine Poche.  You will receive a reminder letter in the mail two months in advance. If you don't receive a letter, please call our office to schedule the follow-up appointment.Marland Kitchen

## 2012-05-12 NOTE — Telephone Encounter (Signed)
Pt called and needed to review instructions for her trilyte prep for her colonoscopy on 05/13/2012. I reviewed it all and she wrote down the information and said she will call if she has other questions.

## 2012-05-13 ENCOUNTER — Encounter (HOSPITAL_COMMUNITY)
Admission: RE | Disposition: A | Discharge status: Home / Self Care | Payer: Self-pay | Source: Ambulatory Visit | Attending: Gastroenterology

## 2012-05-13 ENCOUNTER — Encounter (HOSPITAL_COMMUNITY): Payer: Self-pay | Admitting: *Deleted

## 2012-05-13 ENCOUNTER — Ambulatory Visit (HOSPITAL_COMMUNITY)
Admission: RE | Admit: 2012-05-13 | Discharge: 2012-05-13 | Disposition: A | Discharge status: Home / Self Care | Payer: Medicare HMO | Source: Ambulatory Visit | Attending: Gastroenterology | Admitting: Gastroenterology

## 2012-05-13 DIAGNOSIS — R197 Diarrhea, unspecified: Secondary | ICD-10-CM | POA: Insufficient documentation

## 2012-05-13 DIAGNOSIS — E119 Type 2 diabetes mellitus without complications: Secondary | ICD-10-CM | POA: Insufficient documentation

## 2012-05-13 DIAGNOSIS — Z01812 Encounter for preprocedural laboratory examination: Secondary | ICD-10-CM | POA: Insufficient documentation

## 2012-05-13 DIAGNOSIS — E785 Hyperlipidemia, unspecified: Secondary | ICD-10-CM | POA: Insufficient documentation

## 2012-05-13 DIAGNOSIS — K529 Noninfective gastroenteritis and colitis, unspecified: Secondary | ICD-10-CM

## 2012-05-13 DIAGNOSIS — K648 Other hemorrhoids: Secondary | ICD-10-CM | POA: Insufficient documentation

## 2012-05-13 DIAGNOSIS — K5730 Diverticulosis of large intestine without perforation or abscess without bleeding: Secondary | ICD-10-CM | POA: Insufficient documentation

## 2012-05-13 DIAGNOSIS — I10 Essential (primary) hypertension: Secondary | ICD-10-CM | POA: Insufficient documentation

## 2012-05-13 HISTORY — PX: COLONOSCOPY: SHX5424

## 2012-05-13 SURGERY — COLONOSCOPY
Anesthesia: Moderate Sedation

## 2012-05-13 MED ORDER — MEPERIDINE HCL 100 MG/ML IJ SOLN
INTRAMUSCULAR | Status: AC
  Filled 2012-05-13: qty 1

## 2012-05-13 MED ORDER — MEPERIDINE HCL 100 MG/ML IJ SOLN
INTRAMUSCULAR | Status: DC | PRN
  Administered 2012-05-13 (×2): 25 mg via INTRAVENOUS

## 2012-05-13 MED ORDER — MIDAZOLAM HCL 5 MG/5ML IJ SOLN
INTRAMUSCULAR | Status: DC | PRN
  Administered 2012-05-13 (×2): 1 mg via INTRAVENOUS
  Administered 2012-05-13: 2 mg via INTRAVENOUS

## 2012-05-13 MED ORDER — SODIUM CHLORIDE 0.45 % IV SOLN
Freq: Once | INTRAVENOUS | Status: AC
  Administered 2012-05-13: 1000 mL via INTRAVENOUS

## 2012-05-13 MED ORDER — MIDAZOLAM HCL 5 MG/5ML IJ SOLN
INTRAMUSCULAR | Status: AC
  Filled 2012-05-13: qty 10

## 2012-05-13 MED ORDER — DICYCLOMINE HCL 10 MG PO CAPS: ORAL_CAPSULE | ORAL | Status: DC

## 2012-05-13 MED ORDER — STERILE WATER FOR IRRIGATION IR SOLN
Status: DC | PRN
  Administered 2012-05-13: 12:00:00

## 2012-05-13 NOTE — H&P (Signed)
Primary Care Physician:  Juliette Alcide, MD Primary Gastroenterologist:  Dr. Darrick Penna  Pre-Procedure History & Physical: HPI:  Samantha Ryan is a 78 y.o. female here for  DIARRHEA.  Past Medical History  Diagnosis Date  . Diabetes mellitus   . Hypertension   . Hyperlipidemia   . Vitamin D insufficiency   . OA (osteoarthritis)   . Lactose intolerance   . Rheumatic fever   . Pulmonary fibrosis   . IBS (irritable bowel syndrome)   . History of colon polyps     Past Surgical History  Procedure Date  . Ovarian cyst surgery   . Appendectomy   . Tonsillectomy   . Cataract extraction     Prior to Admission medications   Medication Sig Start Date End Date Taking? Authorizing Provider  amLODipine (NORVASC) 5 MG tablet Take 1 tablet (5 mg total) by mouth daily. 03/31/12  Yes Rollene Rotunda, MD  chlorpheniramine (CHLOR-TRIMETON) 4 MG tablet Take 4 mg by mouth 2 (two) times daily as needed. For allergies   Yes Historical Provider, MD  Cholecalciferol (VITAMIN D-3 PO) Take 1,000 Units by mouth daily.    Yes Historical Provider, MD  Coenzyme Q10 (COQ10 PO) Take 1 tablet by mouth daily.    Yes Historical Provider, MD  fish oil-omega-3 fatty acids 1000 MG capsule Take 1 g by mouth daily.    Yes Historical Provider, MD  fluticasone (FLONASE) 50 MCG/ACT nasal spray Place 2 sprays into the nose daily as needed. For allergies   Yes Historical Provider, MD  guaiFENesin (MUCINEX) 600 MG 12 hr tablet Take 1,200 mg by mouth daily as needed. For allergies   Yes Historical Provider, MD  hydrochlorothiazide (HYDRODIURIL) 25 MG tablet Take 25 mg by mouth daily.  03/17/12  Yes Historical Provider, MD  HYDROcodone-acetaminophen (VICODIN) 5-500 MG per tablet Take 0.5 tablets by mouth every 6 (six) hours as needed. For pain   Yes Historical Provider, MD  levothyroxine (SYNTHROID, LEVOTHROID) 25 MCG tablet Take 25 mcg by mouth daily.  03/24/12  Yes Historical Provider, MD  losartan (COZAAR) 100 MG tablet Take 100  mg by mouth daily.   Yes Historical Provider, MD  magnesium oxide (MAG-OX) 400 MG tablet Take 400 mg by mouth every other day.    Yes Historical Provider, MD  metFORMIN (GLUCOPHAGE) 500 MG tablet Take 500 mg by mouth daily.    Yes Historical Provider, MD  psyllium (METAMUCIL) 58.6 % powder Take 1 packet by mouth daily.   Yes Historical Provider, MD  Red Yeast Rice 600 MG TABS Take 600 mg by mouth daily.   Yes Historical Provider, MD  Turmeric 500 MG CAPS Take 1 tablet by mouth daily.    Yes Historical Provider, MD  zolpidem (AMBIEN) 5 MG tablet Take 5 mg by mouth at bedtime as needed. For sleep 03/21/12  Yes Historical Provider, MD  Rivaroxaban (XARELTO) 20 MG TABS Take 1 tablet (20 mg total) by mouth daily. 05/12/12   Rollene Rotunda, MD  TRILYTE 420 G solution Take 4,000 mLs by mouth once.  05/11/12   Historical Provider, MD    Allergies as of 03/25/2012  . (Not on File)    Family History  Problem Relation Age of Onset  . Stroke Father 72    Cause of Death  . Hypertension Father   . Breast cancer Mother 24    Cause of death    History   Social History  . Marital Status: Widowed    Spouse Name: N/A  Number of Children: 2  . Years of Education: N/A   Occupational History  . Not on file.   Social History Main Topics  . Smoking status: Former Games developer  . Smokeless tobacco: Not on file  . Alcohol Use: No  . Drug Use: No  . Sexually Active: Not on file   Other Topics Concern  . Not on file   Social History Narrative  . No narrative on file    Review of Systems: See HPI, otherwise negative ROS   Physical Exam: There were no vitals taken for this visit. General:   Alert,  pleasant and cooperative in NAD Head:  Normocephalic and atraumatic. Neck:  Supple; Lungs:  Clear throughout to auscultation.    Heart:  Regular rate and rhythm. Abdomen:  Soft, nontender and nondistended. Normal bowel sounds, without guarding, and without rebound.   Neurologic:  Alert and   oriented x4;  grossly normal neurologically.  Impression/Plan:     Diarrhea  PLAN: TCS TODAY WITH BIOPSY

## 2012-05-13 NOTE — Op Note (Signed)
[facility] [address] [place], [address]   COLONOSCOPY PROCEDURE REPORT  PATIENT: Samantha Ryan, Samantha Ryan  MR#: [id number] BIRTHDATE: 17-May-1931 , 81  yrs. old GENDER: Female ENDOSCOPIST: Jonette Eva, MD REFERRED AO:ZHYQMV Burdine, M.D. PROCEDURE DATE:  05/13/2012 PROCEDURE:   Colonoscopy with biopsy INDICATIONS:FORMED STOOL THEN DIARRHEA IN THE MORNING.  PMHx: IBS SINCE 1999, LACTOSE INTOLERANCE.  NOW TRYING TO FOLLOW A MORE STRICT DIET.  HAS STRESS. MEDICATIONS: Demerol 50 mg IV and Versed 4 mg IV  DESCRIPTION OF PROCEDURE:    Physical exam was performed.  Informed consent was obtained from the patient after explaining the benefits, risks, and alternatives to procedure.  The patient was connected to monitor and placed in left lateral position. Continuous oxygen was provided by nasal cannula and IV medicine administered through an indwelling cannula.  After administration of sedation and rectal exam, the patients rectum was intubated and the EC-[id number] ([id number])  colonoscope was advanced under direct visualization to the ileum.  The scope was removed slowly by carefully examining the color, texture, anatomy, and integrity mucosa on the way out.  The patient was recovered in endoscopy and discharged home in satisfactory condition.       COLON FINDINGS: The mucosa appeared normal in the terminal ileum.  10 CM VISUALIZED. , Moderate diverticulosis was noted in the sigmoid colon.  , The colon mucosa was otherwise normal. BIOPSIES OBTAINED VIA COLD FORCEPS TO EVALUATE FOR MICROSCOPIC COLITIS. and Moderate sized internal hemorrhoids were found.  PREP QUALITY: excellent. CECAL W/D TIME: 14 minutes  COMPLICATIONS: None  ENDOSCOPIC IMPRESSION: 1.   Normal mucosa in the terminal ileum 2.   Moderate diverticulosis was noted in the sigmoid colon 3.   The colon mucosa was otherwise normal 4.   Moderate sized internal hemorrhoids   RECOMMENDATIONS: 1.  await biopsy  results 2.  LACTOSE FREE/HIGH FIBER DIET BENTYL 10 MG QHS PROBIOTIC DAILY OPV IN 3 MOS        _______________________________ eSignedJonette Eva, MD 05/13/2012 12:49 PM     PATIENT NAME:  Samantha Ryan, Samantha Ryan MR#: [id number]

## 2012-05-15 ENCOUNTER — Encounter (HOSPITAL_COMMUNITY): Payer: Self-pay | Admitting: Gastroenterology

## 2012-05-19 ENCOUNTER — Telehealth: Payer: Self-pay | Admitting: Gastroenterology

## 2012-05-19 NOTE — Telephone Encounter (Signed)
Recall made 

## 2012-05-19 NOTE — Telephone Encounter (Signed)
PLEASE CALL PT.  Her colon Bx are normal. HER DIARRHEA IS DUE TO IBS & LACTOSE INTOLERANCE.   CONITNUE BENTYL 10 MG AT BEDTIME. Dicylomine can cause drowsiness, dry eyes, dry mouth, blurry vision, and difficulty urinating.   CONTINUE ALIGN DAILY. CHEAPER ALTERNATIVES ARE [employer]S COLON HEALTH OR [employer] BRAND.   FOLLOW A HIGH FIBER/DAIRY FREE DIET. AVOID ITEMS THAT CAUSE BLOATING.   FOLLOW UP IN 3 MOS.

## 2012-05-19 NOTE — Telephone Encounter (Signed)
LMOM to call.

## 2012-05-20 NOTE — Telephone Encounter (Signed)
LMOM to call.

## 2012-05-20 NOTE — Telephone Encounter (Signed)
Path faxed to PCP 

## 2012-05-21 NOTE — Telephone Encounter (Signed)
Pt returned call and was informed.  

## 2012-07-29 ENCOUNTER — Encounter: Payer: Self-pay | Admitting: Gastroenterology

## 2012-07-31 ENCOUNTER — Ambulatory Visit: Payer: Medicare HMO | Admitting: Cardiology

## 2012-08-05 ENCOUNTER — Telehealth: Payer: Self-pay | Admitting: Gastroenterology

## 2012-08-05 NOTE — Telephone Encounter (Signed)
Pt received a letter from recall list to follow up, but she said she was feeling well and didn't feel it was necessary to make OV at this time. I told her to call us if she needed to and we would make her an OV. Pt agreed.

## 2012-08-05 NOTE — Telephone Encounter (Signed)
Routing to Dr. Fields.  

## 2012-08-08 NOTE — Telephone Encounter (Signed)
REVIEWED. AGREE. 

## 2012-08-26 ENCOUNTER — Encounter: Payer: Self-pay | Admitting: Cardiology

## 2012-08-29 ENCOUNTER — Ambulatory Visit: Payer: Medicare HMO | Admitting: Cardiology

## 2012-09-16 ENCOUNTER — Ambulatory Visit: Payer: Medicare HMO | Admitting: Cardiovascular Disease

## 2012-10-20 ENCOUNTER — Ambulatory Visit (INDEPENDENT_AMBULATORY_CARE_PROVIDER_SITE_OTHER): Payer: Medicare Other | Admitting: Cardiovascular Disease

## 2012-10-20 ENCOUNTER — Encounter: Payer: Self-pay | Admitting: Cardiovascular Disease

## 2012-10-20 VITALS — BP 126/85 | HR 71 | Ht 63.0 in | Wt 189.0 lb

## 2012-10-20 DIAGNOSIS — I4891 Unspecified atrial fibrillation: Secondary | ICD-10-CM

## 2012-10-20 NOTE — Progress Notes (Signed)
HPI  The patient presents for follow up of atrial fibrillation. She was seen in 2011 by Dr. Howe Lions for dyspnea. She had a workup for dyspnea. In 2009 she had a stress test in [place]. This demonstrated no evidence of ischemia. An echocardiogram done in 2011 demonstrated an EF of 45-50%. However, followup stress perfusion study demonstrated the EF to be 55% with mild apical thinning.  Because of ongoing dyspnea she saw Dr.Henderson.  Pulmonary function testing was only mildly abnormal there was CT demonstrated some bronchiectasis and pulmonary fibrosis.  Most recent echocardiogram from last year showed preserved systolic ejection fraction with very mild aortic valve stenosis.  She was diagnosed last year with atrial fibrillation. Ventricular rate was controlled without any medication. She was started on Xarelto for anticoagulation. She is tolerating this medication. She complains of occasional nosebleeds which has been self limiting. No other signs of bleeding. She denies chest pain, palpitations or significant dyspnea.  Allergies  Allergen Reactions  . Lactose Intolerance (Gi)     Current Outpatient Prescriptions  Medication Sig Dispense Refill  . Acetylcysteine (NAC 600) 600 MG CAPS Take 1 capsule by mouth 4 (four) times daily.      Marland Kitchen amLODipine (NORVASC) 5 MG tablet Take 1 tablet (5 mg total) by mouth daily.  30 tablet  6  . chlorpheniramine (CHLOR-TRIMETON) 4 MG tablet Take 4 mg by mouth 2 (two) times daily as needed. For allergies      . Cholecalciferol (VITAMIN D-3 PO) Take 1,000 Units by mouth daily.       . Coenzyme Q10 (COQ10 PO) Take 1 tablet by mouth daily.       . fish oil-omega-3 fatty acids 1000 MG capsule Take 1 g by mouth daily.       . fluticasone (FLONASE) 50 MCG/ACT nasal spray Place 2 sprays into the nose daily as needed. For allergies      . guaiFENesin (MUCINEX) 600 MG 12 hr tablet Take 1,200 mg by mouth daily as needed. For allergies      . hydrochlorothiazide  (HYDRODIURIL) 25 MG tablet Take 25 mg by mouth daily.       Marland Kitchen HYDROcodone-acetaminophen (VICODIN) 5-500 MG per tablet Take 0.5 tablets by mouth every 6 (six) hours as needed. For pain      . levothyroxine (SYNTHROID, LEVOTHROID) 25 MCG tablet Take 25 mcg by mouth daily.       Marland Kitchen losartan (COZAAR) 100 MG tablet Take 100 mg by mouth daily.      . magnesium oxide (MAG-OX) 400 MG tablet Take 400 mg by mouth every other day.       . metFORMIN (GLUCOPHAGE) 500 MG tablet Take 500 mg by mouth daily.       . psyllium (METAMUCIL) 58.6 % powder Take 1 packet by mouth daily.      . Red Yeast Rice 600 MG TABS Take 600 mg by mouth daily.      . Rivaroxaban (XARELTO) 20 MG TABS Take 1 tablet (20 mg total) by mouth daily.  30 tablet  6  . Specialty Vitamins Products (ONE-A-DAY BONE STRENGTH) 500-28-100 MG-MG-UNIT TABS Take 1 tablet by mouth 3 (three) times daily.      . Turmeric 500 MG CAPS Take 1 tablet by mouth daily.       Marland Kitchen zolpidem (AMBIEN) 5 MG tablet Take 5 mg by mouth at bedtime as needed. For sleep       No current facility-administered medications for this visit.  Past Medical History  Diagnosis Date  . Diabetes mellitus   . Hypertension   . Hyperlipidemia   . Vitamin D insufficiency   . OA (osteoarthritis)   . Lactose intolerance   . Rheumatic fever   . Pulmonary fibrosis   . IBS (irritable bowel syndrome)   . History of colon polyps     Past Surgical History  Procedure Laterality Date  . Ovarian cyst surgery    . Appendectomy    . Tonsillectomy    . Cataract extraction    . Colonoscopy  05/13/2012    Procedure: COLONOSCOPY;  Surgeon: West Bali, MD;  Location: [facility];  Service: Endoscopy;  Laterality: N/A;  9:30    ROS:  As stated in the HPI and negative for all other systems.  PHYSICAL EXAM BP 126/85  Pulse 71  Ht 5\' 3"  (1.6 m)  Wt 189 lb (85.73 kg)  BMI 33.49 kg/m2 GENERAL:  Well appearing HEENT:  Pupils equal round and reactive, fundi not visualized, oral  mucosa unremarkable NECK:  No jugular venous distention, waveform within normal limits, carotid upstroke brisk and symmetric, no bruits, no thyromegaly LYMPHATICS:  No cervical, inguinal adenopathy LUNGS:  Clear to auscultation bilaterally BACK:  No CVA tenderness CHEST:  Unremarkable HEART:  PMI not displaced or sustained,S1 and S2 within normal limits, no S3, no clicks, no rubs,  irregular . There is a 1/6 systolic ejection murmur at the aortic area which is early peaking. ABD:  Flat, positive bowel sounds normal in frequency in pitch, no bruits, no rebound, no guarding, no midline pulsatile mass, no hepatomegaly, no splenomegaly EXT:  2 plus pulses throughout, no edema, no cyanosis no clubbing SKIN:  No rashes no nodules NEURO:  Cranial nerves II through XII grossly intact, motor grossly intact throughout PSYCH:  Cognitively intact, oriented to person place and time   ASSESSMENT AND PLAN  Atrial fibrillation:  Ventricular rate is well controlled without any medication.continue anticoagulation with Xarelto. She reports having routine labs done by Dr. Leandrew Koyanagi.   HTN:  Her blood pressure is well controlled on current medications. No change in therapy is indicated.  Dyspnea:  This seems to be chronic and improved. No change in therapy is indicated.  Diabetes:  This is being followed by Juliette Alcide, MD

## 2012-10-20 NOTE — Patient Instructions (Addendum)
Continue same medications.  Follow up in 6 months.  

## 2012-11-03 ENCOUNTER — Encounter: Payer: Self-pay | Admitting: Cardiology

## 2012-11-03 ENCOUNTER — Encounter: Payer: Self-pay | Admitting: Physician Assistant

## 2013-01-02 ENCOUNTER — Other Ambulatory Visit: Payer: Self-pay | Admitting: Cardiology

## 2013-04-23 ENCOUNTER — Ambulatory Visit (INDEPENDENT_AMBULATORY_CARE_PROVIDER_SITE_OTHER): Payer: Medicare Other | Admitting: Cardiology

## 2013-04-23 ENCOUNTER — Encounter: Payer: Self-pay | Admitting: Cardiology

## 2013-04-23 VITALS — BP 139/86 | HR 64 | Ht 63.5 in | Wt 177.0 lb

## 2013-04-23 DIAGNOSIS — I10 Essential (primary) hypertension: Secondary | ICD-10-CM

## 2013-04-23 DIAGNOSIS — I4891 Unspecified atrial fibrillation: Secondary | ICD-10-CM

## 2013-04-23 NOTE — Patient Instructions (Addendum)

## 2013-04-23 NOTE — Progress Notes (Signed)
HPI  The patient presents for follow up of atrial fibrillation with a controlled ventricular rate and mild aortic stenosis.  In the spring she was hospitalized with community-acquired pneumonia and was quite ill. However, I don't see any mention in the hospital notes of any uncontrolled heart rhythm. She has made a slow recovery. She does have underlying pulmonary fibrosis and was sent home on oxygen but she is now wearing this when necessary. She's not been particularly active as she recovers. She's not describing any new PND or orthopnea. She's not been having any chest pressure, neck or arm discomfort. She's had no presyncope or syncope. She has had no weight gain or edema.  Allergies  Allergen Reactions  . Lactose Intolerance (Gi)     Current Outpatient Prescriptions  Medication Sig Dispense Refill  . Acetylcysteine (NAC 600) 600 MG CAPS Take 1 capsule by mouth 3 (three) times daily.       Marland Kitchen albuterol (PROVENTIL HFA;VENTOLIN HFA) 108 (90 BASE) MCG/ACT inhaler Inhale 2 puffs into the lungs every 6 (six) hours as needed for wheezing.      Marland Kitchen amLODipine (NORVASC) 5 MG tablet Take 1 tablet (5 mg total) by mouth daily.  30 tablet  6  . Bioflavonoids (BIOFLAVONOID-1000) 1000 MG TABS Take 1,000 mg by mouth daily.      . chlorpheniramine (CHLOR-TRIMETON) 4 MG tablet Take 4 mg by mouth 2 (two) times daily as needed. For allergies      . Cholecalciferol (VITAMIN D-3 PO) Take 1,000 Units by mouth daily.       . Coenzyme Q10 (COQ10 PO) Take 1 tablet by mouth daily.       . fish oil-omega-3 fatty acids 1000 MG capsule Take 2 g by mouth daily.       . fluticasone (FLONASE) 50 MCG/ACT nasal spray Place 2 sprays into the nose daily as needed. For allergies      . guaiFENesin (MUCINEX) 600 MG 12 hr tablet Take 1,200 mg by mouth daily as needed. For allergies      . hydrochlorothiazide (HYDRODIURIL) 25 MG tablet Take 25 mg by mouth daily.       Marland Kitchen HYDROcodone-acetaminophen (NORCO/VICODIN) 5-325 MG per  tablet Take 1 tablet by mouth every 6 (six) hours as needed for pain.      Marland Kitchen ipratropium (ATROVENT) 0.03 % nasal spray Place 2 sprays into the nose every 12 (twelve) hours.      Marland Kitchen ipratropium (ATROVENT) 0.06 % nasal spray Place 2 sprays into the nose 2 (two) times daily as needed for rhinitis.      Marland Kitchen levothyroxine (SYNTHROID, LEVOTHROID) 25 MCG tablet Take 25 mcg by mouth daily.       Marland Kitchen loratadine (CLARITIN) 10 MG tablet Take 10 mg by mouth as needed for allergies.      Marland Kitchen losartan (COZAAR) 100 MG tablet Take 100 mg by mouth daily.      . Magnesium 250 MG TABS Take 250 mg by mouth daily.      . metFORMIN (GLUCOPHAGE) 500 MG tablet Take 500 mg by mouth daily with breakfast.       . predniSONE (DELTASONE) 20 MG tablet Take 20 mg by mouth every other day.      . psyllium (METAMUCIL) 58.6 % powder Take 1 packet by mouth daily.      . Red Yeast Rice 600 MG TABS Take 600 mg by mouth daily.      Marland Kitchen Specialty Vitamins Products (ONE-A-DAY BONE STRENGTH) 500-28-100 MG-MG-UNIT  TABS Take 1 tablet by mouth 3 (three) times daily.      . Turmeric 500 MG CAPS Take 1 tablet by mouth daily.       Carlena Hurl 20 MG TABS TAKE 1 TABLET BY MOUTH DAILY.  30 tablet  5  . zolpidem (AMBIEN) 5 MG tablet Take 5 mg by mouth at bedtime as needed. For sleep       No current facility-administered medications for this visit.    Past Medical History  Diagnosis Date  . Diabetes mellitus   . Hypertension   . Hyperlipidemia   . Vitamin D insufficiency   . OA (osteoarthritis)   . Lactose intolerance   . Rheumatic fever   . Pulmonary fibrosis   . IBS (irritable bowel syndrome)   . History of colon polyps     Past Surgical History  Procedure Laterality Date  . Ovarian cyst surgery    . Appendectomy    . Tonsillectomy    . Cataract extraction    . Colonoscopy  05/13/2012    Procedure: COLONOSCOPY;  Surgeon: West Bali, MD;  [facility]: [facility];  Service: Endoscopy;  Laterality: N/A;  9:30    ROS:  As stated in  the HPI and negative for all other systems.  PHYSICAL EXAM BP 139/86  Pulse 64  Ht 5' 3.5" (1.613 m)  Wt 177 lb (80.287 kg)  BMI 30.86 kg/m2 GENERAL:  Well appearing HEENT:  Pupils equal round and reactive, fundi not visualized, oral mucosa unremarkable NECK:  No jugular venous distention, waveform within normal limits, carotid upstroke brisk and symmetric, no bruits, no thyromegaly LYMPHATICS:  No cervical, inguinal adenopathy LUNGS:  Diffuse fine crackles BACK:  No CVA tenderness CHEST:  Unremarkable HEART:  PMI not displaced or sustained,S1 and S2 within normal limits, no S3, no clicks, no rubs, no murmurs, irregular  ABD:  Flat, positive bowel sounds normal in frequency in pitch, no bruits, no rebound, no guarding, no midline pulsatile mass, no hepatomegaly, no splenomegaly EXT:  2 plus pulses throughout, no edema, no cyanosis mild clubbing SKIN:  No rashes no nodules NEURO:  Cranial nerves II through XII grossly intact, motor grossly intact throughout PSYCH:  Cognitively intact, oriented to person place and time  ASSESSMENT AND PLAN  Atrial fibrillation:   Ms. SHELI DORIN has a CHA2DS2 - VASc score of 5 with a risk of stroke of 6.7%.  She tolerates this rhythm and anticoagulation. No change in therapy is indicated.  HTN:  Her blood pressure is better controlled after her Norvasc was increased at the last visit. No change in therapy is indicated.  Diabetes:  This is being followed by Juliette Alcide, MD  AS:  This has been very mild in the past and we will follow this clinically.

## 2013-06-15 ENCOUNTER — Telehealth: Payer: Self-pay | Admitting: *Deleted

## 2013-06-15 MED ORDER — RIVAROXABAN 20 MG PO TABS: 20.0000 mg | ORAL_TABLET | Freq: Every day | ORAL | Status: DC

## 2013-06-15 NOTE — Telephone Encounter (Signed)
Informed pharmacy staff that samples left up front for patient. She stated she would call and inform the patient.

## 2013-06-15 NOTE — Telephone Encounter (Signed)
Received call from pharmacy requesting xarelto be changed to something different due to patient being in the donut hole and can't afford xarelto at this time. Nurse left message on vm of patient to call office. Nurse will give enough samples to last throughout remainder of the year.

## 2013-11-09 ENCOUNTER — Other Ambulatory Visit: Payer: Self-pay | Admitting: Cardiology

## 2013-11-09 MED ORDER — RIVAROXABAN 20 MG PO TABS: 20.0000 mg | ORAL_TABLET | Freq: Every day | ORAL | Status: DC

## 2013-11-09 NOTE — Telephone Encounter (Signed)
Pt came into office requesting Xarelto 20 MG samples. Gave pt 3 bottles for a total of 15 tablets. LOT # [id number] EXP 08/17

## 2013-11-25 ENCOUNTER — Other Ambulatory Visit: Payer: Self-pay | Admitting: Cardiology

## 2014-01-04 ENCOUNTER — Other Ambulatory Visit (HOSPITAL_COMMUNITY): Payer: Self-pay

## 2014-01-04 DIAGNOSIS — J8410 Pulmonary fibrosis, unspecified: Secondary | ICD-10-CM

## 2014-01-13 ENCOUNTER — Ambulatory Visit (HOSPITAL_COMMUNITY)
Admission: RE | Admit: 2014-01-13 | Discharge: 2014-01-13 | Disposition: A | Discharge status: Home / Self Care | Payer: Medicare Other | Source: Ambulatory Visit | Attending: Pulmonary Disease | Admitting: Pulmonary Disease

## 2014-01-13 DIAGNOSIS — J8410 Pulmonary fibrosis, unspecified: Secondary | ICD-10-CM | POA: Insufficient documentation

## 2014-01-13 MED ORDER — ALBUTEROL SULFATE (2.5 MG/3ML) 0.083% IN NEBU
2.5000 mg | INHALATION_SOLUTION | Freq: Once | RESPIRATORY_TRACT | Status: AC
  Administered 2014-01-13: 2.5 mg via RESPIRATORY_TRACT

## 2014-01-20 LAB — PULMONARY FUNCTION TEST
DL/VA % pred: 65 %
DL/VA: 3.23 ml/min/mmHg/L
DLCO UNC: 7.79 ml/min/mmHg
DLCO cor % pred: 30 %
DLCO cor: 7.79 ml/min/mmHg
DLCO unc % pred: 30 %
FEF 25-75 Post: 3.04 L/sec
FEF 25-75 Pre: 3.26 L/sec
FEF2575-%CHANGE-POST: -6 %
FEF2575-%PRED-PRE: 231 %
FEF2575-%Pred-Post: 215 %
FEV1-%CHANGE-POST: -1 %
FEV1-%PRED-POST: 85 %
FEV1-%PRED-PRE: 86 %
FEV1-PRE: 1.73 L
FEV1-Post: 1.7 L
FEV1FVC-%Change-Post: 0 %
FEV1FVC-%PRED-PRE: 122 %
FEV6-%Change-Post: -2 %
FEV6-%PRED-POST: 73 %
FEV6-%Pred-Pre: 75 %
FEV6-POST: 1.87 L
FEV6-PRE: 1.92 L
FEV6FVC-%PRED-PRE: 106 %
FEV6FVC-%Pred-Post: 106 %
FVC-%Change-Post: -2 %
FVC-%PRED-PRE: 71 %
FVC-%Pred-Post: 69 %
FVC-POST: 1.87 L
FVC-Pre: 1.92 L
POST FEV1/FVC RATIO: 91 %
POST FEV6/FVC RATIO: 100 %
PRE FEV6/FVC RATIO: 100 %
Pre FEV1/FVC ratio: 90 %
RV % pred: 49 %
RV: 1.21 L
TLC % PRED: 57 %
TLC: 3 L

## 2014-03-30 ENCOUNTER — Encounter: Payer: Self-pay | Admitting: Cardiology

## 2014-03-30 ENCOUNTER — Ambulatory Visit: Payer: Medicare Other | Admitting: Cardiology

## 2014-03-30 NOTE — Progress Notes (Unsigned)
Clinical Summary Samantha Ryan is a 80 y.o.female last seen by Dr Antoine Poche, this is our first visit together. She is seen for the following medical problems.  1. Afib  - on anticoag. Xarelto expensive  2. Aortic stenosis - echo 03/2012 AVA 1.5 VTI, mean grad 6 per echo report   3. HTN   4.  Past Medical History  Diagnosis Date  . Diabetes mellitus   . Hypertension   . Hyperlipidemia   . Vitamin D insufficiency   . OA (osteoarthritis)   . Lactose intolerance   . Rheumatic fever   . Pulmonary fibrosis   . IBS (irritable bowel syndrome)   . History of colon polyps   . Atrial fibrillation      Allergies  Allergen Reactions  . Lactose Intolerance (Gi)      Current Outpatient Prescriptions  Medication Sig Dispense Refill  . Acetylcysteine (NAC 600) 600 MG CAPS Take 1 capsule by mouth 3 (three) times daily.       Marland Kitchen albuterol (PROVENTIL HFA;VENTOLIN HFA) 108 (90 BASE) MCG/ACT inhaler Inhale 2 puffs into the lungs every 6 (six) hours as needed for wheezing.      Marland Kitchen amLODipine (NORVASC) 5 MG tablet Take 1 tablet (5 mg total) by mouth daily.  30 tablet  6  . Bioflavonoids (BIOFLAVONOID-1000) 1000 MG TABS Take 1,000 mg by mouth daily.      . chlorpheniramine (CHLOR-TRIMETON) 4 MG tablet Take 4 mg by mouth 2 (two) times daily as needed. For allergies      . Cholecalciferol (VITAMIN D-3 PO) Take 1,000 Units by mouth daily.       . Coenzyme Q10 (COQ10 PO) Take 1 tablet by mouth daily.       . fish oil-omega-3 fatty acids 1000 MG capsule Take 2 g by mouth daily.       . fluticasone (FLONASE) 50 MCG/ACT nasal spray Place 2 sprays into the nose daily as needed. For allergies      . guaiFENesin (MUCINEX) 600 MG 12 hr tablet Take 1,200 mg by mouth daily as needed. For allergies      . hydrochlorothiazide (HYDRODIURIL) 25 MG tablet Take 25 mg by mouth daily.       Marland Kitchen HYDROcodone-acetaminophen (NORCO/VICODIN) 5-325 MG per tablet Take 1 tablet by mouth every 6 (six) hours as needed for  pain.      Marland Kitchen ipratropium (ATROVENT) 0.03 % nasal spray Place 2 sprays into the nose every 12 (twelve) hours.      Marland Kitchen ipratropium (ATROVENT) 0.06 % nasal spray Place 2 sprays into the nose 2 (two) times daily as needed for rhinitis.      Marland Kitchen levothyroxine (SYNTHROID, LEVOTHROID) 25 MCG tablet Take 25 mcg by mouth daily.       Marland Kitchen loratadine (CLARITIN) 10 MG tablet Take 10 mg by mouth as needed for allergies.      Marland Kitchen losartan (COZAAR) 100 MG tablet Take 100 mg by mouth daily.      . Magnesium 250 MG TABS Take 250 mg by mouth daily.      . metFORMIN (GLUCOPHAGE) 500 MG tablet Take 500 mg by mouth daily with breakfast.       . predniSONE (DELTASONE) 20 MG tablet Take 20 mg by mouth every other day.      . psyllium (METAMUCIL) 58.6 % powder Take 1 packet by mouth daily.      . Red Yeast Rice 600 MG TABS Take 600 mg by mouth daily.      Marland Kitchen  Rivaroxaban (XARELTO) 20 MG TABS tablet Take 1 tablet (20 mg total) by mouth daily.  15 tablet  0  . Specialty Vitamins Products (ONE-A-DAY BONE STRENGTH) 500-28-100 MG-MG-UNIT TABS Take 1 tablet by mouth 3 (three) times daily.      . Turmeric 500 MG CAPS Take 1 tablet by mouth daily.       Carlena Hurl. XARELTO 20 MG TABS tablet TAKE 1 TABLET BY MOUTH DAILY.  30 tablet  5  . zolpidem (AMBIEN) 5 MG tablet Take 5 mg by mouth at bedtime as needed. For sleep       No current facility-administered medications for this visit.     Past Surgical History  Procedure Laterality Date  . Ovarian cyst surgery    . Appendectomy    . Tonsillectomy    . Cataract extraction    . Colonoscopy  05/13/2012    Procedure: COLONOSCOPY;  Surgeon: West BaliSandi L Fields, MD;  Location: [facility];  Service: Endoscopy;  Laterality: N/A;  9:30     Allergies  Allergen Reactions  . Lactose Intolerance (Gi)       Family History  Problem Relation Age of Onset  . Stroke Father [id number]    Cause of Death  . Hypertension Father   . Breast cancer Mother [id number]    Cause of death     Social History Samantha Ryan  reports that she quit smoking about 43 years ago. Her smoking use included Cigarettes. She started smoking about 50 years ago. She has a 3.5 pack-year smoking history. She has never used smokeless tobacco. Samantha Ryan reports that she does not drink alcohol.   Review of Systems CONSTITUTIONAL: No weight loss, fever, chills, weakness or fatigue.  HEENT: Eyes: No visual loss, blurred vision, double vision or yellow sclerae.No hearing loss, sneezing, congestion, runny nose or sore throat.  SKIN: No rash or itching.  CARDIOVASCULAR:  RESPIRATORY: No shortness of breath, cough or sputum.  GASTROINTESTINAL: No anorexia, nausea, vomiting or diarrhea. No abdominal pain or blood.  GENITOURINARY: No burning on urination, no polyuria NEUROLOGICAL: No headache, dizziness, syncope, paralysis, ataxia, numbness or tingling in the extremities. No change in bowel or bladder control.  MUSCULOSKELETAL: No muscle, back pain, joint pain or stiffness.  LYMPHATICS: No enlarged nodes. No history of splenectomy.  PSYCHIATRIC: No history of depression or anxiety.  ENDOCRINOLOGIC: No reports of sweating, cold or heat intolerance. No polyuria or polydipsia.  Marland Kitchen.   Physical Examination There were no vitals filed for this visit. There were no vitals filed for this visit.  Gen: resting comfortably, no acute distress HEENT: no scleral icterus, pupils equal round and reactive, no palptable cervical adenopathy,  CV Resp: Clear to auscultation bilaterally GI: abdomen is soft, non-tender, non-distended, normal bowel sounds, no hepatosplenomegaly MSK: extremities are warm, no edema.  Skin: warm, no rash Neuro:  no focal deficits Psych: appropriate affect   Diagnostic Studies 03/2012 Echo Study Conclusions  - Left ventricle: The cavity size was normal. Wall thickness was increased in a pattern of mild LVH. Systolic function was low normal. The estimated ejection fraction was in the range of 50% to 55%. Although no  diagnostic regional wall motion abnormality was identified, this possibility cannot be completely excluded on the basis of this study. The study is not technically sufficient to allow evaluation of LV diastolic function. - Aortic valve: Trileaflet; mildly thickened leaflets. Mean gradient: 6mm Hg (S). - Mitral valve: Mildly thickened leaflets . Mild regurgitation. - Left atrium: The atrium was  mildly dilated. - Tricuspid valve: Mild-moderate regurgitation. Peak RV-RA gradient: 20mm Hg (S). - Pericardium, extracardiac: There was no pericardial effusion.      Assessment and Plan        Antoine Poche, M.D., F.A.C.C.

## 2014-03-31 ENCOUNTER — Encounter: Payer: Self-pay | Admitting: Cardiology

## 2014-03-31 ENCOUNTER — Ambulatory Visit (INDEPENDENT_AMBULATORY_CARE_PROVIDER_SITE_OTHER): Payer: Medicare Other | Admitting: Cardiology

## 2014-03-31 VITALS — BP 130/79 | HR 71 | Ht 64.0 in | Wt 172.0 lb

## 2014-03-31 DIAGNOSIS — I4891 Unspecified atrial fibrillation: Secondary | ICD-10-CM

## 2014-03-31 DIAGNOSIS — I10 Essential (primary) hypertension: Secondary | ICD-10-CM

## 2014-03-31 NOTE — Progress Notes (Signed)
Clinical Summary [name] is a 80 y.o.female last seen by Dr Antoine PocheHochrein, this is our first visit together. She is seen for the following medical problems.  1. Afib  - denies any palpitations. Has not required AV nodal agents.  - on anticoag with xarelto without bleeding issues.    2. HTN  - compliant with meds    Past Medical History  Diagnosis Date  . Diabetes mellitus   . Hypertension   . Hyperlipidemia   . Vitamin D insufficiency   . OA (osteoarthritis)   . Lactose intolerance   . Rheumatic fever   . Pulmonary fibrosis   . IBS (irritable bowel syndrome)   . History of colon polyps   . Atrial fibrillation      Allergies  Allergen Reactions  . Lactose Intolerance (Gi)      Current Outpatient Prescriptions  Medication Sig Dispense Refill  . Acetylcysteine (NAC 600) 600 MG CAPS Take 1 capsule by mouth 3 (three) times daily.       Marland Kitchen. albuterol (PROVENTIL HFA;VENTOLIN HFA) 108 (90 BASE) MCG/ACT inhaler Inhale 2 puffs into the lungs every 6 (six) hours as needed for wheezing.      Marland Kitchen. amLODipine (NORVASC) 5 MG tablet Take 1 tablet (5 mg total) by mouth daily.  30 tablet  6  . Bioflavonoids (BIOFLAVONOID-1000) 1000 MG TABS Take 1,000 mg by mouth daily.      . chlorpheniramine (CHLOR-TRIMETON) 4 MG tablet Take 4 mg by mouth 2 (two) times daily as needed. For allergies      . Cholecalciferol (VITAMIN D-3 PO) Take 1,000 Units by mouth daily.       . Coenzyme Q10 (COQ10 PO) Take 1 tablet by mouth daily.       . fish oil-omega-3 fatty acids 1000 MG capsule Take 2 g by mouth daily.       . fluticasone (FLONASE) 50 MCG/ACT nasal spray Place 2 sprays into the nose daily as needed. For allergies      . guaiFENesin (MUCINEX) 600 MG 12 hr tablet Take 1,200 mg by mouth daily as needed. For allergies      . hydrochlorothiazide (HYDRODIURIL) 25 MG tablet Take 25 mg by mouth daily.       Marland Kitchen. HYDROcodone-acetaminophen (NORCO/VICODIN) 5-325 MG per tablet Take 1 tablet by mouth every 6  (six) hours as needed for pain.      Marland Kitchen. ipratropium (ATROVENT) 0.03 % nasal spray Place 2 sprays into the nose every 12 (twelve) hours.      Marland Kitchen. ipratropium (ATROVENT) 0.06 % nasal spray Place 2 sprays into the nose 2 (two) times daily as needed for rhinitis.      Marland Kitchen. levothyroxine (SYNTHROID, LEVOTHROID) 25 MCG tablet Take 25 mcg by mouth daily.       Marland Kitchen. loratadine (CLARITIN) 10 MG tablet Take 10 mg by mouth as needed for allergies.      Marland Kitchen. losartan (COZAAR) 100 MG tablet Take 100 mg by mouth daily.      . Magnesium 250 MG TABS Take 250 mg by mouth daily.      . metFORMIN (GLUCOPHAGE) 500 MG tablet Take 500 mg by mouth daily with breakfast.       . predniSONE (DELTASONE) 20 MG tablet Take 20 mg by mouth every other day.      . psyllium (METAMUCIL) 58.6 % powder Take 1 packet by mouth daily.      . Red Yeast Rice 600 MG TABS Take 600 mg  by mouth daily.      . Rivaroxaban (XARELTO) 20 MG TABS tablet Take 1 tablet (20 mg total) by mouth daily.  15 tablet  0  . Specialty Vitamins Products (ONE-A-DAY BONE STRENGTH) 500-28-100 MG-MG-UNIT TABS Take 1 tablet by mouth 3 (three) times daily.      . Turmeric 500 MG CAPS Take 1 tablet by mouth daily.       Carlena Hurl 20 MG TABS tablet TAKE 1 TABLET BY MOUTH DAILY.  30 tablet  5  . zolpidem (AMBIEN) 5 MG tablet Take 5 mg by mouth at bedtime as needed. For sleep       No current facility-administered medications for this visit.     Past Surgical History  Procedure Laterality Date  . Ovarian cyst surgery    . Appendectomy    . Tonsillectomy    . Cataract extraction    . Colonoscopy  05/13/2012    Procedure: COLONOSCOPY;  Surgeon: West Bali, MD;  Location: [facility];  Service: Endoscopy;  Laterality: N/A;  9:30     Allergies  Allergen Reactions  . Lactose Intolerance (Gi)       Family History  Problem Relation Age of Onset  . Stroke Father 67    Cause of Death  . Hypertension Father   . Breast cancer Mother 55    Cause of death      Social History Samantha Ryan reports that she quit smoking about 43 years ago. Her smoking use included Cigarettes. She started smoking about 50 years ago. She has a 3.5 pack-year smoking history. She has never used smokeless tobacco. Samantha Ryan reports that she does not drink alcohol.   Review of Systems CONSTITUTIONAL: No weight loss, fever, chills, weakness or fatigue.  HEENT: Eyes: No visual loss, blurred vision, double vision or yellow sclerae.No hearing loss, sneezing, congestion, runny nose or sore throat.  SKIN: No rash or itching.  CARDIOVASCULAR: per HPI RESPIRATORY: No shortness of breath, cough or sputum.  GASTROINTESTINAL: No anorexia, nausea, vomiting or diarrhea. No abdominal pain or blood.  GENITOURINARY: No burning on urination, no polyuria NEUROLOGICAL: No headache, dizziness, syncope, paralysis, ataxia, numbness or tingling in the extremities. No change in bowel or bladder control.  MUSCULOSKELETAL: No muscle, back pain, joint pain or stiffness.  LYMPHATICS: No enlarged nodes. No history of splenectomy.  PSYCHIATRIC: No history of depression or anxiety.  ENDOCRINOLOGIC: No reports of sweating, cold or heat intolerance. No polyuria or polydipsia.  Marland Kitchen   Physical Examination p  Gen: resting comfortably, no acute distress HEENT: no scleral icterus, pupils equal round and reactive, no palptable cervical adenopathy,  CV Resp: Clear to auscultation bilaterally GI: abdomen is soft, non-tender, non-distended, normal bowel sounds, no hepatosplenomegaly MSK: extremities are warm, no edema.  Skin: warm, no rash Neuro:  no focal deficits Psych: appropriate affect   Diagnostic Studies 03/2012 Echo  Study Conclusions  - Left ventricle: The cavity size was normal. Wall thickness was increased in a pattern of mild LVH. Systolic function was low normal. The estimated ejection fraction was in the range of 50% to 55%. Although no diagnostic regional wall motion abnormality was  identified, this possibility cannot be completely excluded on the basis of this study. The study is not technically sufficient to allow evaluation of LV diastolic function. - Aortic valve: Trileaflet; mildly thickened leaflets. Mean gradient: 6mm Hg (S). - Mitral valve: Mildly thickened leaflets . Mild regurgitation. - Left atrium: The atrium was mildly dilated. - Tricuspid  valve: Mild-moderate regurgitation. Peak RV-RA gradient: 20mm Hg (S). - Pericardium, extracardiac: There was no pericardial effusion.      Assessment and Plan   1. Afib - denies any current symptoms, has not required AV nodal agents - continue xarelto  2. HTN - at goal, continue current meds     Antoine Poche, M.D., F.A.C.C.

## 2014-03-31 NOTE — Patient Instructions (Signed)
   Compression stockings - script provided today Continue all current medications. Your physician wants you to follow up in:  1 year.  You will receive a reminder letter in the mail one-two months in advance.  If you don't receive a letter, please call our office to schedule the follow up appointment

## 2014-05-12 ENCOUNTER — Other Ambulatory Visit: Payer: Self-pay | Admitting: *Deleted

## 2014-05-12 MED ORDER — RIVAROXABAN 20 MG PO TABS: 20.0000 mg | ORAL_TABLET | Freq: Every day | ORAL | Status: DC

## 2014-05-18 ENCOUNTER — Telehealth: Payer: Self-pay | Admitting: *Deleted

## 2014-05-18 MED ORDER — RIVAROXABAN 20 MG PO TABS: 20.0000 mg | ORAL_TABLET | Freq: Every day | ORAL | Status: DC

## 2014-05-18 NOTE — Telephone Encounter (Signed)
Message left on voice mail - requesting samples of Xarelto.  Returned call to patient - requesting samples of Xarelto  as her co-pay is $30.00 & she just can't afford that right now.  She states that she also will be in the doughnut hole soon.  Samples provided (#15) & two other assistance options given to patient.  Low income subsidy program and [facility] Patient [employer] info also provided.

## 2014-10-07 ENCOUNTER — Other Ambulatory Visit: Payer: Self-pay | Admitting: Cardiology

## 2014-10-07 ENCOUNTER — Telehealth: Payer: Self-pay | Admitting: *Deleted

## 2014-10-07 MED ORDER — RIVAROXABAN 20 MG PO TABS
20.0000 mg | ORAL_TABLET | Freq: Every day | ORAL | Status: DC
Start: 2014-10-07 — End: 2015-04-28

## 2014-10-07 NOTE — Telephone Encounter (Signed)
Pt made aware samples are available for pickup and [facility]

## 2014-10-07 NOTE — Telephone Encounter (Signed)
Pt requesting samples of xarelto, [facility], left samples for pt to pickup 10/07/14. Signed out, pt made aware and will pick up this afternoon.

## 2014-10-07 NOTE — Telephone Encounter (Signed)
Patient walk in.  Patient has one Xalerto left for today.  She stopped by to see if she could possibly pick up samples tomorrow

## 2014-10-27 ENCOUNTER — Ambulatory Visit (INDEPENDENT_AMBULATORY_CARE_PROVIDER_SITE_OTHER): Payer: Medicare Other | Admitting: Cardiology

## 2014-10-27 ENCOUNTER — Encounter: Payer: Self-pay | Admitting: *Deleted

## 2014-10-27 ENCOUNTER — Encounter: Payer: Self-pay | Admitting: Cardiology

## 2014-10-27 VITALS — BP 110/72 | HR 81 | Ht 64.0 in | Wt 172.0 lb

## 2014-10-27 DIAGNOSIS — I4891 Unspecified atrial fibrillation: Secondary | ICD-10-CM

## 2014-10-27 DIAGNOSIS — R0602 Shortness of breath: Secondary | ICD-10-CM

## 2014-10-27 DIAGNOSIS — I10 Essential (primary) hypertension: Secondary | ICD-10-CM

## 2014-10-27 MED ORDER — FUROSEMIDE 20 MG PO TABS: 20.0000 mg | ORAL_TABLET | Freq: Every day | ORAL | Status: DC

## 2014-10-27 NOTE — Patient Instructions (Signed)
Your physician wants you to follow-up in: 6 months with Dr. Lurena JoinerBranch You will receive a reminder letter in the mail two months in advance. If you don't receive a letter, please call our office to schedule the follow-up appointment.  Your physician has recommended you make the following change in your medication:   STOP TAKING HCTZ  START LASIX 20 MG AS NEEDED FOR SWELLING  CONTINUE ON ALL OTHER MEDICATIONS AS DIRECTED WE HAVE GIVEN YOU SAMPLES OF XARELTO  Your physician has requested that you have an echocardiogram. Echocardiography is a painless test that uses sound waves to create images of your heart. It provides your doctor with information about the size and shape of your heart and how well your heart's chambers and valves are working. This procedure takes approximately one hour. There are no restrictions for this procedure.  Thank you for choosing [facility]!!

## 2014-10-27 NOTE — Progress Notes (Signed)
Clinical Summary Samantha Ryan is a 81 y.o.female seen today for follow up of the following medical problems.   1. Afib  - denies any palpitations. - Has not required any AV nodal agents.  - on anticoag with xarelto without bleeding issues. Notes some occasional mild nosebleeds   2. HTN  - compliant with meds  3. Pulmonary fibrosis - followed at [facility] - denies any significant LE edema   Past Medical History  Diagnosis Date  . Diabetes mellitus   . Hypertension   . Hyperlipidemia   . Vitamin D insufficiency   . OA (osteoarthritis)   . Lactose intolerance   . Rheumatic fever   . Pulmonary fibrosis   . IBS (irritable bowel syndrome)   . History of colon polyps   . Atrial fibrillation      Allergies  Allergen Reactions  . Lactose Intolerance (Gi)      Current Outpatient Prescriptions  Medication Sig Dispense Refill  . Acetylcysteine (NAC 600) 600 MG CAPS Take 1 capsule by mouth 3 (three) times daily.     Marland Kitchen. albuterol (PROVENTIL HFA;VENTOLIN HFA) 108 (90 BASE) MCG/ACT inhaler Inhale 2 puffs into the lungs every 6 (six) hours as needed for wheezing.    Marland Kitchen. amLODipine (NORVASC) 5 MG tablet Take 1 tablet (5 mg total) by mouth daily. 30 tablet 6  . Bioflavonoids (BIOFLAVONOID-1000) 1000 MG TABS Take 1,000 mg by mouth daily.    . chlorpheniramine (CHLOR-TRIMETON) 4 MG tablet Take 4 mg by mouth 2 (two) times daily as needed. For allergies    . Cholecalciferol (VITAMIN D-3 PO) Take 1,000 Units by mouth daily.     . Coenzyme Q10 (COQ10 PO) Take 1 tablet by mouth daily.     . fish oil-omega-3 fatty acids 1000 MG capsule Take 2 g by mouth daily.     . fluticasone (FLONASE) 50 MCG/ACT nasal spray Place 2 sprays into the nose daily as needed. For allergies    . guaiFENesin (MUCINEX) 600 MG 12 hr tablet Take 1,200 mg by mouth daily as needed. For allergies    . hydrochlorothiazide (HYDRODIURIL) 25 MG tablet Take 25 mg by mouth daily.     Marland Kitchen. HYDROcodone-acetaminophen (NORCO/VICODIN)  5-325 MG per tablet Take 1 tablet by mouth every 6 (six) hours as needed for pain.    Marland Kitchen. levothyroxine (SYNTHROID, LEVOTHROID) 25 MCG tablet Take 25 mcg by mouth daily.     Marland Kitchen. loratadine (CLARITIN) 10 MG tablet Take 10 mg by mouth as needed for allergies.    Marland Kitchen. losartan (COZAAR) 100 MG tablet Take 100 mg by mouth daily.    . Magnesium 250 MG TABS Take 250 mg by mouth daily.    . metFORMIN (GLUCOPHAGE) 500 MG tablet Take 500 mg by mouth daily with breakfast.     . psyllium (METAMUCIL) 58.6 % powder Take 1 packet by mouth daily.    . Red Yeast Rice 600 MG TABS Take 600 mg by mouth daily.    . rivaroxaban (XARELTO) 20 MG TABS tablet Take 1 tablet (20 mg total) by mouth daily. 30 tablet 3  . Specialty Vitamins Products (ONE-A-DAY BONE STRENGTH) 500-28-100 MG-MG-UNIT TABS Take 1 tablet by mouth 3 (three) times daily.    . Turmeric 500 MG CAPS Take 1 tablet by mouth daily.      No current facility-administered medications for this visit.     Past Surgical History  Procedure Laterality Date  . Ovarian cyst surgery    . Appendectomy    .  Tonsillectomy    . Cataract extraction    . Colonoscopy  05/13/2012    Procedure: COLONOSCOPY;  Surgeon: West Bali, MD;  Location: [facility];  Service: Endoscopy;  Laterality: N/A;  9:30     Allergies  Allergen Reactions  . Lactose Intolerance (Gi)       Family History  Problem Relation Age of Onset  . Stroke Father 37    Cause of Death  . Hypertension Father   . Breast cancer Mother 54    Cause of death     Social History Samantha Ryan reports that she quit smoking about 44 years ago. Her smoking use included Cigarettes. She started smoking about 51 years ago. She has a 3.5 pack-year smoking history. She has never used smokeless tobacco. Samantha Ryan reports that she does not drink alcohol.   Review of Systems CONSTITUTIONAL: No weight loss, fever, chills, weakness or fatigue.  HEENT: Eyes: No visual loss, blurred vision, double vision or  yellow sclerae.No hearing loss, sneezing, congestion, runny nose or sore throat.  SKIN: No rash or itching.  CARDIOVASCULAR: per HPI RESPIRATORY: No shortness of breath, cough or sputum.  GASTROINTESTINAL: No anorexia, nausea, vomiting or diarrhea. No abdominal pain or blood.  GENITOURINARY: No burning on urination, no polyuria NEUROLOGICAL: No headache, dizziness, syncope, paralysis, ataxia, numbness or tingling in the extremities. No change in bowel or bladder control.  MUSCULOSKELETAL: No muscle, back pain, joint pain or stiffness.  LYMPHATICS: No enlarged nodes. No history of splenectomy.  PSYCHIATRIC: No history of depression or anxiety.  ENDOCRINOLOGIC: No reports of sweating, cold or heat intolerance. No polyuria or polydipsia.  Marland Kitchen   Physical Examination p 81 bp 110/72 Wt 172 lbs BMI 30 Gen: resting comfortably, no acute distress HEENT: no scleral icterus, pupils equal round and reactive, no palptable cervical adenopathy,  CV: RRR, no m/r/g, no JVD Resp: bilateral dry crackles GI: abdomen is soft, non-tender, non-distended, normal bowel sounds, no hepatosplenomegaly MSK: extremities are warm, no edema.  Skin: warm, no rash Neuro:  no focal deficits Psych: appropriate affect   Diagnostic Studies  03/2012 Echo  Study Conclusions  - Left ventricle: The cavity size was normal. Wall thickness was increased in a pattern of mild LVH. Systolic function was low normal. The estimated ejection fraction was in the range of 50% to 55%. Although no diagnostic regional wall motion abnormality was identified, this possibility cannot be completely excluded on the basis of this study. The study is not technically sufficient to allow evaluation of LV diastolic function. - Aortic valve: Trileaflet; mildly thickened leaflets. Mean gradient: 6mm Hg (S). - Mitral valve: Mildly thickened leaflets . Mild regurgitation. - Left atrium: The atrium was mildly dilated. - Tricuspid valve:  Mild-moderate regurgitation. Peak RV-RA gradient: 20mm Hg (S). - Pericardium, extracardiac: There was no pericardial effusion.   Assessment and Plan  1. Afib - denies any current symptoms, has not required AV nodal agents - continue xarelto  2. HTN - at goal, continue current meds  3. Pulmonary fibrosis - followed by [facility] - chronic stable SOB  4. LE edema - change HCTZ to lasix prn. Obtain echo in setting of pulm fibrosis to eval for pulm HTN, RV function  F/u 6 months  Antoine Poche, M.D.

## 2014-10-28 ENCOUNTER — Other Ambulatory Visit: Payer: Self-pay

## 2014-10-28 ENCOUNTER — Other Ambulatory Visit (INDEPENDENT_AMBULATORY_CARE_PROVIDER_SITE_OTHER): Payer: Medicare Other

## 2014-10-28 DIAGNOSIS — I4891 Unspecified atrial fibrillation: Secondary | ICD-10-CM

## 2014-10-28 DIAGNOSIS — R0602 Shortness of breath: Secondary | ICD-10-CM | POA: Diagnosis not present

## 2014-10-29 ENCOUNTER — Telehealth: Payer: Self-pay | Admitting: *Deleted

## 2014-10-29 NOTE — Telephone Encounter (Signed)
Pt made aware, forwarded to Dr. Leandrew KoyanagiBurdine. Pt asked that we please send Dr. Wyline MoodBranch thank you for her. Will forward as FYI

## 2014-10-29 NOTE — Telephone Encounter (Signed)
-----   Message from Antoine PocheJonathan F Branch, MD sent at 10/29/2014 12:35 PM EST ----- Echo overall looks good. Overall her heart function is good  Dominga FerryJ Branch MD

## 2015-01-20 ENCOUNTER — Telehealth: Payer: Self-pay | Admitting: *Deleted

## 2015-01-20 NOTE — Telephone Encounter (Signed)
Pt walked in requesting samples of xarelto. Gave pt 4 bottles along with medicare LIS to apply for. Lot#[id number] exp. 01/2017

## 2015-04-28 ENCOUNTER — Other Ambulatory Visit: Payer: Self-pay | Admitting: Cardiology

## 2015-05-10 ENCOUNTER — Ambulatory Visit (INDEPENDENT_AMBULATORY_CARE_PROVIDER_SITE_OTHER): Payer: Medicare Other | Admitting: Cardiology

## 2015-05-10 ENCOUNTER — Encounter: Payer: Self-pay | Admitting: Cardiology

## 2015-05-10 VITALS — BP 146/81 | HR 73 | Ht 64.0 in | Wt 168.0 lb

## 2015-05-10 DIAGNOSIS — I4891 Unspecified atrial fibrillation: Secondary | ICD-10-CM

## 2015-05-10 DIAGNOSIS — I10 Essential (primary) hypertension: Secondary | ICD-10-CM | POA: Diagnosis not present

## 2015-05-10 MED ORDER — FUROSEMIDE 20 MG PO TABS: ORAL_TABLET | ORAL | Status: DC

## 2015-05-10 MED ORDER — RIVAROXABAN 20 MG PO TABS: 20.0000 mg | ORAL_TABLET | Freq: Every day | ORAL | Status: DC

## 2015-05-10 NOTE — Progress Notes (Signed)
Patient ID: KEAGAN ANTHIS, female   DOB: Jun 25, 1931, 81 y.o.   MRN: [id number]     Clinical Summary Ms. Labell is a 81 y.o.female seen today for follow up of the following medical problems.   1. Afib  - denies any palpitations. - Has not required any AV nodal agents.  - on anticoag with xarelto. Denies any recent bleeding   2. HTN  - compliant with meds  3. Pulmonary fibrosis - followed at [facility] - notes some worsening in her SOB/DOE, has f/u at [facility] coming up - using home O2 as needed.    Past Medical History  Diagnosis Date  . Diabetes mellitus   . Hypertension   . Hyperlipidemia   . Vitamin D insufficiency   . OA (osteoarthritis)   . Lactose intolerance   . Rheumatic fever   . Pulmonary fibrosis   . IBS (irritable bowel syndrome)   . History of colon polyps   . Atrial fibrillation      Allergies  Allergen Reactions  . Lactose Intolerance (Gi)      Current Outpatient Prescriptions  Medication Sig Dispense Refill  . Acetylcysteine (NAC 600) 600 MG CAPS Take 1 capsule by mouth 3 (three) times daily.     Marland Kitchen amLODipine (NORVASC) 5 MG tablet Take 1 tablet (5 mg total) by mouth daily. 30 tablet 6  . Ascorbic Acid (VITAMIN C) 1000 MG tablet Take 1 tablet by mouth daily.    . chlorpheniramine (CHLOR-TRIMETON) 4 MG tablet Take 4 mg by mouth 2 (two) times daily as needed. For allergies    . Cholecalciferol (VITAMIN D-3 PO) Take 1,000 Units by mouth daily.     . Coenzyme Q10 (COQ10 PO) Take 1 tablet by mouth daily.     . Fenugreek 500 MG CAPS Take 1 capsule by mouth daily as needed.    . fish oil-omega-3 fatty acids 1000 MG capsule Take 2 g by mouth 2 (two) times a week.     . fluticasone (FLONASE) 50 MCG/ACT nasal spray Place 2 sprays into the nose daily as needed. For allergies    . furosemide (LASIX) 20 MG tablet Take 1 tablet (20 mg total) by mouth daily. 90 tablet 3  . Ginger Root POWD Take 1 tablet by mouth daily.    Marland Kitchen guaiFENesin (MUCINEX) 600 MG 12 hr tablet Take  1,200 mg by mouth daily as needed. For allergies    . levothyroxine (SYNTHROID, LEVOTHROID) 25 MCG tablet Take 25 mcg by mouth daily.     Marland Kitchen loratadine (CLARITIN) 10 MG tablet Take 10 mg by mouth as needed for allergies.    Marland Kitchen losartan (COZAAR) 100 MG tablet Take 100 mg by mouth daily.    . Magnesium 250 MG TABS Take 250 mg by mouth daily.    . metFORMIN (GLUCOPHAGE) 500 MG tablet Take 500 mg by mouth daily with breakfast.     . OVER THE COUNTER MEDICATION Take 4 capsules by mouth daily. sero-vital with growth hormone    . psyllium (METAMUCIL) 58.6 % powder Take 1 packet by mouth daily.    . Red Yeast Rice 600 MG TABS Take 600 mg by mouth daily.    Marland Kitchen Specialty Vitamins Products (ONE-A-DAY BONE STRENGTH) 500-28-100 MG-MG-UNIT TABS Take 1 tablet by mouth 3 (three) times daily.    . Turmeric 500 MG CAPS Take 1 tablet by mouth daily.     Carlena Hurl 20 MG TABS tablet TAKE 1 TABLET BY MOUTH EVERY DAY 30 tablet 3  No current facility-administered medications for this visit.     Past Surgical History  Procedure Laterality Date  . Ovarian cyst surgery    . Appendectomy    . Tonsillectomy    . Cataract extraction    . Colonoscopy  05/13/2012    Procedure: COLONOSCOPY;  Surgeon: West Bali, MD;  Location: [facility];  Service: Endoscopy;  Laterality: N/A;  9:30     Allergies  Allergen Reactions  . Lactose Intolerance (Gi)       Family History  Problem Relation Age of Onset  . Stroke Father 63    Cause of Death  . Hypertension Father   . Breast cancer Mother 28    Cause of death     Social History Ms. Battisti reports that she quit smoking about 44 years ago. Her smoking use included Cigarettes. She started smoking about 51 years ago. She has a 3.5 pack-year smoking history. She has never used smokeless tobacco. Ms. Asante reports that she does not drink alcohol.   Review of Systems CONSTITUTIONAL: No weight loss, fever, chills, weakness or fatigue.  HEENT: Eyes: No visual loss,  blurred vision, double vision or yellow sclerae.No hearing loss, sneezing, congestion, runny nose or sore throat.  SKIN: No rash or itching.  CARDIOVASCULAR: per HPI RESPIRATORY: +SOB GASTROINTESTINAL: No anorexia, nausea, vomiting or diarrhea. No abdominal pain or blood.  GENITOURINARY: No burning on urination, no polyuria NEUROLOGICAL: No headache, dizziness, syncope, paralysis, ataxia, numbness or tingling in the extremities. No change in bowel or bladder control.  MUSCULOSKELETAL: No muscle, back pain, joint pain or stiffness.  LYMPHATICS: No enlarged nodes. No history of splenectomy.  PSYCHIATRIC: No history of depression or anxiety.  ENDOCRINOLOGIC: No reports of sweating, cold or heat intolerance. No polyuria or polydipsia.  Marland Kitchen   Physical Examination Filed Vitals:   05/10/15 1617  BP: 146/81  Pulse: 73   Filed Vitals:   05/10/15 1617  Height:  (1.626 m)  Weight: 168 lb (76.204 kg)   Manual bp: 135/85 Gen: resting comfortably, no acute distress HEENT: no scleral icterus, pupils equal round and reactive, no palptable cervical adenopathy,  CV: RRR, no m/r/g, no JVD Resp: Clear to auscultation bilaterally GI: abdomen is soft, non-tender, non-distended, normal bowel sounds, no hepatosplenomegaly MSK: extremities are warm, no edema.  Skin: warm, no rash Neuro:  no focal deficits Psych: appropriate affect   Diagnostic Studies  03/2012 Echo  Study Conclusions  - Left ventricle: The cavity size was normal. Wall thickness was increased in a pattern of mild LVH. Systolic function was low normal. The estimated ejection fraction was in the range of 50% to 55%. Although no diagnostic regional wall motion abnormality was identified, this possibility cannot be completely excluded on the basis of this study. The study is not technically sufficient to allow evaluation of LV diastolic function. - Aortic valve: Trileaflet; mildly thickened leaflets. Mean gradient: 6mm Hg  (S). - Mitral valve: Mildly thickened leaflets . Mild regurgitation. - Left atrium: The atrium was mildly dilated. - Tricuspid valve: Mild-moderate regurgitation. Peak RV-RA gradient: 20mm Hg (S). - Pericardium, extracardiac: There was no pericardial effusion.  10/2014 echo Study Conclusions  - Left ventricle: The cavity size was normal. Wall thickness was increased in a pattern of mild LVH. The estimated ejection fraction was 55%. Wall motion was normal; there were no regional wall motion abnormalities. Findings consistent with left ventricular diastolic dysfunction. Doppler parameters are consistent with high ventricular filling pressure. - Aortic valve: Sclerosis  without stenosis. There was no significant regurgitation. - Mitral valve: There is mitral leaflet thickening. There is mild MR, but no MS. The valve does not have the classic appearance of a hockey puck deformity. - Left atrium: The atrium was mildly dilated. - Right ventricle: The cavity size was normal. Systolic function was mildly reduced. - Right atrium: The atrium was mildly dilated.   Assessment and Plan   1. Afib - denies any current symptoms, has not required AV nodal agents - continue current meds including xarelto for stroke prevention  2. HTN - at goal, continue current meds  3. Pulmonary fibrosis - followed by [facility] - chronic stable SOB    F/u 6 months      Antoine Poche, M.D.

## 2015-05-10 NOTE — Patient Instructions (Signed)
Your physician wants you to follow-up in: 6 MONTHS WITH DR. BRANCH You will receive a reminder letter in the mail two months in advance. If you don't receive a letter, please call our office to schedule the follow-up appointment.  Your physician has recommended you make the following change in your medication:   CHANGE LASIX 20 MG DAILY AS NEEDED FOR SWELLING   WE HAVE GIVEN YOU XARELTO SAMPLES  Thank you for choosing [facility]!!

## 2015-06-14 ENCOUNTER — Encounter (HOSPITAL_COMMUNITY): Payer: Medicare Other

## 2015-06-16 ENCOUNTER — Encounter (HOSPITAL_COMMUNITY)
Admission: RE | Admit: 2015-06-16 | Discharge: 2015-06-16 | Disposition: A | Discharge status: Home / Self Care | Payer: Medicare Other | Source: Ambulatory Visit | Attending: Family Medicine | Admitting: Family Medicine

## 2015-06-16 VITALS — BP 130/78 | HR 76 | Ht 63.0 in | Wt 171.0 lb

## 2015-06-16 DIAGNOSIS — J8410 Pulmonary fibrosis, unspecified: Secondary | ICD-10-CM | POA: Insufficient documentation

## 2015-06-16 DIAGNOSIS — J849 Interstitial pulmonary disease, unspecified: Secondary | ICD-10-CM | POA: Diagnosis not present

## 2015-06-16 NOTE — Progress Notes (Signed)
Patient arrived for 1st visit/orientation/education at 1430.  Patient was referred to PR by Dr. Leandrew KoyanagiBurdine due to ILD J84.9. During orientation advised patient on arrival and appointment times what to wear, what to do before, during and after exercise. Reviewed attendance and class policy. Talked about inclement weather and class consultation policy. Pt is scheduled to return Pulm Rehab on 06/21/15 at 1330. Pt was advised to come to class 5 minutes before class starts. He was also given instructions on meeting with the dietician and attending the Family Structure classes. Pt is eager to get started. Patient was able to complete 6 minute walk test with 3L of oxygen. Patient was measured for the equipment. Discussed equipment safety with patient. Discussed pursed lip breathing. Handouts given to demonstrate technique. Patient scored 0 on PHQ-2. She scored 7 on PHQ-9.  Patient did not wish to seek counsel at this time. Took patient pre-anthropometric measurements. Patient finished visit at 1700.

## 2015-06-16 NOTE — Patient Instructions (Signed)
Pt has finished orientation and is scheduled to start PR on 06/21/15 at 1330. Pt has been instructed to arrive to class 15 minutes early for scheduled class. Pt has been instructed to wear comfortable clothing and shoes with rubber soles. Pt has been told to take their medications 1 hour prior to coming to class.  If the patient is not going to attend class, he/she has been instructed to call.

## 2015-06-16 NOTE — Progress Notes (Signed)
Cardiac/Pulmonary Rehab Medication Review by a Pharmacist  Does the patient  feel that his/her medications are working for him/her?  yes  Has the patient been experiencing any side effects to the medications prescribed?  no  Does the patient measure his/her own blood pressure or blood glucose at home?  no   Does the patient have any problems obtaining medications due to transportation or finances?   no  Understanding of regimen: good Understanding of indications: good Potential of compliance: good  Questions asked to Determine Patient Understanding of Medication Regimen:  1. What is the name of the medication?  2. What is the medication used for?  3. When should it be taken?  4. How much should be taken?  5. How will you take it?  6. What side effects should you report?  Understanding Defined as: Excellent: All questions above are correct Good: Questions 1-4 are correct Fair: Questions 1-2 are correct  Poor: 1 or none of the above questions are correct   Pharmacist comments: Pt is not complaining of any side effects from medications.  Pt takes a lot of supplements but states she will likely cut back because not sure they are working.  States Xarelto is expensive.  Pt states she will ask Dr about using an inhaler to help with breathing.    Valrie HartHall, Danniela Mcbrearty A 06/16/2015 3:49 PM

## 2015-06-21 ENCOUNTER — Encounter (HOSPITAL_COMMUNITY)
Admission: RE | Admit: 2015-06-21 | Discharge: 2015-06-21 | Disposition: A | Discharge status: Home / Self Care | Payer: Medicare Other | Source: Ambulatory Visit | Attending: Family Medicine | Admitting: Family Medicine

## 2015-06-21 DIAGNOSIS — J8410 Pulmonary fibrosis, unspecified: Secondary | ICD-10-CM | POA: Diagnosis not present

## 2015-06-23 ENCOUNTER — Encounter (HOSPITAL_COMMUNITY)
Admission: RE | Admit: 2015-06-23 | Discharge: 2015-06-23 | Disposition: A | Discharge status: Home / Self Care | Payer: Medicare Other | Source: Ambulatory Visit | Attending: Family Medicine | Admitting: Family Medicine

## 2015-06-23 DIAGNOSIS — J8410 Pulmonary fibrosis, unspecified: Secondary | ICD-10-CM | POA: Diagnosis not present

## 2015-06-28 ENCOUNTER — Encounter (HOSPITAL_COMMUNITY): Payer: Medicare Other

## 2015-06-28 NOTE — Progress Notes (Signed)
Pulmonary Rehabilitation Program Outcomes Report   Orientation:  06/16/15 Graduate Date:  tbd Discharge Date:  tbd # of sessions completed: 3  Pulmonologist: Burdine Family MD:  Burdine Class Time:  1330  A.  Exercise Program:  Tolerates exercise @ 3.53 METS for 15 minutes and Walk Test Results:  Pre: 2.45 mets  B.  Mental Health:  Good mental attitude and PHQ-9: 7. patient stated she was not interested in counseling.   C.  Education/Instruction/Skills  Uses Perceived Exertion Scale and/or Dyspnea Scale  Demonstrates accurate pursed lip breathing  D.  Nutrition/Weight Control/Body Composition:  Adherence to prescribed nutrition program: fair    E.  Blood Lipids    Lab Results  Component Value Date   CHOL 262* 03/12/2012   TRIG 197 03/12/2012    F.  Lifestyle Changes:  Making positive lifestyle changes and Not smoking:  Quit 1973  G.  Symptoms noted with exercise:  Asymptomatic  Report Completed By:  Doretha Sou Dian Laprade RN   Comments:  This is the patients first week progress note for [facility].

## 2015-06-30 ENCOUNTER — Encounter (HOSPITAL_COMMUNITY): Payer: Medicare Other

## 2015-07-05 ENCOUNTER — Encounter (HOSPITAL_COMMUNITY)
Admission: RE | Admit: 2015-07-05 | Discharge: 2015-07-05 | Disposition: A | Discharge status: Home / Self Care | Payer: Medicare Other | Source: Ambulatory Visit | Attending: Family Medicine | Admitting: Family Medicine

## 2015-07-05 DIAGNOSIS — J849 Interstitial pulmonary disease, unspecified: Secondary | ICD-10-CM | POA: Insufficient documentation

## 2015-07-05 DIAGNOSIS — J8410 Pulmonary fibrosis, unspecified: Secondary | ICD-10-CM | POA: Diagnosis not present

## 2015-07-07 ENCOUNTER — Encounter (HOSPITAL_COMMUNITY)
Admission: RE | Admit: 2015-07-07 | Discharge: 2015-07-07 | Disposition: A | Discharge status: Home / Self Care | Payer: Medicare Other | Source: Ambulatory Visit | Attending: Family Medicine | Admitting: Family Medicine

## 2015-07-07 DIAGNOSIS — J8410 Pulmonary fibrosis, unspecified: Secondary | ICD-10-CM | POA: Diagnosis not present

## 2015-07-12 ENCOUNTER — Encounter (HOSPITAL_COMMUNITY)
Admission: RE | Admit: 2015-07-12 | Discharge: 2015-07-12 | Disposition: A | Discharge status: Home / Self Care | Payer: Medicare Other | Source: Ambulatory Visit | Attending: Family Medicine | Admitting: Family Medicine

## 2015-07-12 DIAGNOSIS — J8410 Pulmonary fibrosis, unspecified: Secondary | ICD-10-CM | POA: Diagnosis not present

## 2015-07-14 ENCOUNTER — Encounter (HOSPITAL_COMMUNITY)
Admission: RE | Admit: 2015-07-14 | Discharge: 2015-07-14 | Disposition: A | Discharge status: Home / Self Care | Payer: Medicare Other | Source: Ambulatory Visit | Attending: Family Medicine | Admitting: Family Medicine

## 2015-07-14 DIAGNOSIS — J8410 Pulmonary fibrosis, unspecified: Secondary | ICD-10-CM | POA: Diagnosis not present

## 2015-07-19 ENCOUNTER — Encounter (HOSPITAL_COMMUNITY)
Admission: RE | Admit: 2015-07-19 | Discharge: 2015-07-19 | Disposition: A | Discharge status: Home / Self Care | Payer: Medicare Other | Source: Ambulatory Visit | Attending: Family Medicine | Admitting: Family Medicine

## 2015-07-19 DIAGNOSIS — J8410 Pulmonary fibrosis, unspecified: Secondary | ICD-10-CM | POA: Diagnosis not present

## 2015-07-21 ENCOUNTER — Encounter (HOSPITAL_COMMUNITY): Payer: Medicare Other

## 2015-07-26 ENCOUNTER — Encounter (HOSPITAL_COMMUNITY)
Admission: RE | Admit: 2015-07-26 | Discharge: 2015-07-26 | Disposition: A | Discharge status: Home / Self Care | Payer: Medicare Other | Source: Ambulatory Visit | Attending: Family Medicine | Admitting: Family Medicine

## 2015-07-26 DIAGNOSIS — J8410 Pulmonary fibrosis, unspecified: Secondary | ICD-10-CM | POA: Diagnosis not present

## 2015-07-28 ENCOUNTER — Encounter (HOSPITAL_COMMUNITY)
Admission: RE | Admit: 2015-07-28 | Discharge: 2015-07-28 | Disposition: A | Discharge status: Home / Self Care | Payer: Medicare Other | Source: Ambulatory Visit | Attending: Family Medicine | Admitting: Family Medicine

## 2015-07-28 DIAGNOSIS — J8410 Pulmonary fibrosis, unspecified: Secondary | ICD-10-CM | POA: Diagnosis present

## 2015-07-28 DIAGNOSIS — J849 Interstitial pulmonary disease, unspecified: Secondary | ICD-10-CM | POA: Insufficient documentation

## 2015-08-02 ENCOUNTER — Encounter (HOSPITAL_COMMUNITY)
Admission: RE | Admit: 2015-08-02 | Discharge: 2015-08-02 | Disposition: A | Discharge status: Home / Self Care | Payer: Medicare Other | Source: Ambulatory Visit | Attending: Family Medicine | Admitting: Family Medicine

## 2015-08-02 DIAGNOSIS — J8410 Pulmonary fibrosis, unspecified: Secondary | ICD-10-CM | POA: Diagnosis not present

## 2015-08-02 NOTE — Progress Notes (Signed)
Patient was given individual home exercise plan. Handout was reviewed and discussed. Patient's long term goals were re-evaluated. Patient verbalized an understanding. 

## 2015-08-04 ENCOUNTER — Encounter (HOSPITAL_COMMUNITY)
Admission: RE | Admit: 2015-08-04 | Discharge: 2015-08-04 | Disposition: A | Discharge status: Home / Self Care | Payer: Medicare Other | Source: Ambulatory Visit | Attending: Family Medicine | Admitting: Family Medicine

## 2015-08-04 DIAGNOSIS — J8410 Pulmonary fibrosis, unspecified: Secondary | ICD-10-CM | POA: Diagnosis not present

## 2015-08-09 ENCOUNTER — Encounter (HOSPITAL_COMMUNITY)
Admission: RE | Admit: 2015-08-09 | Discharge: 2015-08-09 | Disposition: A | Discharge status: Home / Self Care | Payer: Medicare Other | Source: Ambulatory Visit | Attending: Family Medicine | Admitting: Family Medicine

## 2015-08-09 ENCOUNTER — Other Ambulatory Visit: Payer: Self-pay | Admitting: Cardiology

## 2015-08-09 ENCOUNTER — Other Ambulatory Visit: Payer: Self-pay | Admitting: *Deleted

## 2015-08-09 DIAGNOSIS — J8410 Pulmonary fibrosis, unspecified: Secondary | ICD-10-CM | POA: Diagnosis not present

## 2015-08-09 MED ORDER — RIVAROXABAN 20 MG PO TABS: 20.0000 mg | ORAL_TABLET | Freq: Every day | ORAL | Status: DC

## 2015-08-09 NOTE — Telephone Encounter (Signed)
Samples given to patient  

## 2015-08-09 NOTE — Telephone Encounter (Signed)
IS HERE asking for Xarelto samples   [facility] was [facility] at [facility] that she is in the doughnut hole    She has 3 days supply left

## 2015-08-11 ENCOUNTER — Encounter (HOSPITAL_COMMUNITY): Payer: Medicare Other

## 2015-08-16 ENCOUNTER — Encounter (HOSPITAL_COMMUNITY)
Admission: RE | Admit: 2015-08-16 | Discharge: 2015-08-16 | Disposition: A | Discharge status: Home / Self Care | Payer: Medicare Other | Source: Ambulatory Visit | Attending: Family Medicine | Admitting: Family Medicine

## 2015-08-16 DIAGNOSIS — J8410 Pulmonary fibrosis, unspecified: Secondary | ICD-10-CM | POA: Diagnosis not present

## 2015-08-18 ENCOUNTER — Encounter (HOSPITAL_COMMUNITY)
Admission: RE | Admit: 2015-08-18 | Discharge: 2015-08-18 | Disposition: A | Discharge status: Home / Self Care | Payer: Medicare Other | Source: Ambulatory Visit | Attending: Family Medicine | Admitting: Family Medicine

## 2015-08-18 DIAGNOSIS — J8410 Pulmonary fibrosis, unspecified: Secondary | ICD-10-CM | POA: Diagnosis not present

## 2015-08-23 ENCOUNTER — Encounter (HOSPITAL_COMMUNITY): Payer: Medicare Other

## 2015-08-25 ENCOUNTER — Encounter (HOSPITAL_COMMUNITY)
Admission: RE | Admit: 2015-08-25 | Discharge: 2015-08-25 | Disposition: A | Discharge status: Home / Self Care | Payer: Medicare Other | Source: Ambulatory Visit | Attending: Family Medicine | Admitting: Family Medicine

## 2015-08-25 DIAGNOSIS — J8410 Pulmonary fibrosis, unspecified: Secondary | ICD-10-CM | POA: Diagnosis not present

## 2015-08-30 ENCOUNTER — Encounter (HOSPITAL_COMMUNITY)
Admission: RE | Admit: 2015-08-30 | Discharge: 2015-08-30 | Disposition: A | Discharge status: Home / Self Care | Payer: Medicare Other | Source: Ambulatory Visit | Attending: Family Medicine | Admitting: Family Medicine

## 2015-08-30 DIAGNOSIS — J849 Interstitial pulmonary disease, unspecified: Secondary | ICD-10-CM | POA: Diagnosis not present

## 2015-08-30 DIAGNOSIS — J8410 Pulmonary fibrosis, unspecified: Secondary | ICD-10-CM | POA: Diagnosis present

## 2015-09-01 ENCOUNTER — Encounter (HOSPITAL_COMMUNITY)
Admission: RE | Admit: 2015-09-01 | Discharge: 2015-09-01 | Disposition: A | Discharge status: Home / Self Care | Payer: Medicare Other | Source: Ambulatory Visit | Attending: Family Medicine | Admitting: Family Medicine

## 2015-09-01 DIAGNOSIS — J8410 Pulmonary fibrosis, unspecified: Secondary | ICD-10-CM | POA: Diagnosis not present

## 2015-09-06 ENCOUNTER — Encounter (HOSPITAL_COMMUNITY): Payer: Medicare Other

## 2015-09-08 ENCOUNTER — Encounter (HOSPITAL_COMMUNITY)
Admission: RE | Admit: 2015-09-08 | Discharge: 2015-09-08 | Disposition: A | Discharge status: Home / Self Care | Payer: Medicare Other | Source: Ambulatory Visit | Attending: Family Medicine | Admitting: Family Medicine

## 2015-09-08 DIAGNOSIS — J8410 Pulmonary fibrosis, unspecified: Secondary | ICD-10-CM | POA: Diagnosis not present

## 2015-09-13 ENCOUNTER — Encounter (HOSPITAL_COMMUNITY): Payer: Medicare Other

## 2015-09-15 ENCOUNTER — Encounter (HOSPITAL_COMMUNITY)
Admission: RE | Admit: 2015-09-15 | Discharge: 2015-09-15 | Disposition: A | Discharge status: Home / Self Care | Payer: Medicare Other | Source: Ambulatory Visit | Attending: Family Medicine | Admitting: Family Medicine

## 2015-09-15 DIAGNOSIS — J8410 Pulmonary fibrosis, unspecified: Secondary | ICD-10-CM | POA: Diagnosis not present

## 2015-09-16 NOTE — Progress Notes (Signed)
Pulmonary Rehabilitation Program Outcomes Report   Orientation:  06/16/15 Graduate Date:  tbd Discharge Date:  tbd # of sessions completed: 18  Pulmonologist: burdine Family MD:  Burdine Class Time:  1330  A.  Exercise Program:  Tolerates exercise @ 2.00 METS for 15 minutes  B.  Mental Health:  Good mental attitude  C.  Education/Instruction/Skills  Uses Perceived Exertion Scale and/or Dyspnea Scale  Demonstrates accurate pursed lip breathing  D.  Nutrition/Weight Control/Body Composition:  Adherence to prescribed nutrition program: fair    E.  Blood Lipids    Lab Results  Component Value Date   CHOL 262* 03/12/2012   TRIG 197 03/12/2012    F.  Lifestyle Changes:  Making positive lifestyle changes  G.  Symptoms noted with exercise:  Asymptomatic  Report Completed By:  Samantha Sou RN   Comments:  This is the patients half way progress note for [facility]. Patient is progressing fair in the program.

## 2015-09-20 ENCOUNTER — Encounter (HOSPITAL_COMMUNITY)
Admission: RE | Admit: 2015-09-20 | Discharge: 2015-09-20 | Disposition: A | Discharge status: Home / Self Care | Payer: Medicare Other | Source: Ambulatory Visit | Attending: Family Medicine | Admitting: Family Medicine

## 2015-09-20 DIAGNOSIS — J8410 Pulmonary fibrosis, unspecified: Secondary | ICD-10-CM | POA: Diagnosis not present

## 2015-09-22 ENCOUNTER — Encounter (HOSPITAL_COMMUNITY)
Admission: RE | Admit: 2015-09-22 | Discharge: 2015-09-22 | Disposition: A | Discharge status: Home / Self Care | Payer: Medicare Other | Source: Ambulatory Visit | Attending: Family Medicine | Admitting: Family Medicine

## 2015-09-22 DIAGNOSIS — J8410 Pulmonary fibrosis, unspecified: Secondary | ICD-10-CM | POA: Diagnosis not present

## 2015-09-27 ENCOUNTER — Other Ambulatory Visit: Payer: Self-pay | Admitting: Cardiology

## 2015-09-27 ENCOUNTER — Encounter (HOSPITAL_COMMUNITY): Payer: Medicare Other

## 2015-09-29 ENCOUNTER — Encounter (HOSPITAL_COMMUNITY): Payer: Medicare Other

## 2015-10-04 ENCOUNTER — Encounter (HOSPITAL_COMMUNITY)
Admission: RE | Admit: 2015-10-04 | Discharge: 2015-10-04 | Disposition: A | Discharge status: Home / Self Care | Payer: Medicare Other | Source: Ambulatory Visit | Attending: Family Medicine | Admitting: Family Medicine

## 2015-10-04 DIAGNOSIS — J849 Interstitial pulmonary disease, unspecified: Secondary | ICD-10-CM | POA: Insufficient documentation

## 2015-10-04 DIAGNOSIS — J8410 Pulmonary fibrosis, unspecified: Secondary | ICD-10-CM | POA: Diagnosis not present

## 2015-10-06 ENCOUNTER — Encounter (HOSPITAL_COMMUNITY)
Admission: RE | Admit: 2015-10-06 | Discharge: 2015-10-06 | Disposition: A | Discharge status: Home / Self Care | Payer: Medicare Other | Source: Ambulatory Visit | Attending: Family Medicine | Admitting: Family Medicine

## 2015-10-06 DIAGNOSIS — J8410 Pulmonary fibrosis, unspecified: Secondary | ICD-10-CM | POA: Diagnosis not present

## 2015-10-11 ENCOUNTER — Encounter (HOSPITAL_COMMUNITY)
Admission: RE | Admit: 2015-10-11 | Discharge: 2015-10-11 | Disposition: A | Discharge status: Home / Self Care | Payer: Medicare Other | Source: Ambulatory Visit | Attending: Family Medicine | Admitting: Family Medicine

## 2015-10-11 DIAGNOSIS — J8410 Pulmonary fibrosis, unspecified: Secondary | ICD-10-CM | POA: Diagnosis not present

## 2015-10-13 ENCOUNTER — Encounter (HOSPITAL_COMMUNITY)
Admission: RE | Admit: 2015-10-13 | Discharge: 2015-10-13 | Disposition: A | Discharge status: Home / Self Care | Payer: Medicare Other | Source: Ambulatory Visit | Attending: Family Medicine | Admitting: Family Medicine

## 2015-10-13 DIAGNOSIS — J8410 Pulmonary fibrosis, unspecified: Secondary | ICD-10-CM | POA: Diagnosis not present

## 2015-10-17 ENCOUNTER — Telehealth: Payer: Self-pay | Admitting: Cardiology

## 2015-10-17 DIAGNOSIS — R001 Bradycardia, unspecified: Secondary | ICD-10-CM

## 2015-10-17 NOTE — Telephone Encounter (Signed)
Samantha Ryan calls today stating that she is concerned about her heart rate. States that heart rate has been going up and down for approximately one week. Patient is On oxygen. She has appointment on 11/07/15 but feels like she needs to be seen sooner.

## 2015-10-17 NOTE — Telephone Encounter (Signed)
Measuring heart rates can be difficult in afib because the electrical devices have a tough time keeping count and often can give a false number. In order to really see her heart rate trends she would need to wear a monitor. Can we set up her with a 24 hr holter   Dominga Ferry MD

## 2015-10-17 NOTE — Telephone Encounter (Signed)
Patient stated that she had heart rate of 38 last Wednesday.  A few days after that, running 40-50's range.  Most recently running 57 - 60's range.  Her o2 sats are usually 91%.  No chest pain.  Stated that she felt lightheaded and just happen to catch the low reading.  Stated it is possible that she may be having this more often & not aware.  Has been noticing more tiredness & lightheadedness lately.  Patient states that she is not in any distress, but thought she should run it by her doctor.  Stated in the meantime, she will see if she can get OV with Dr. Juanetta Gosling since she has not seen him in the last 6 months.   Hard to tell whether lung or heart related.  Patient informed message will be sent to provider for further advice.

## 2015-10-18 ENCOUNTER — Encounter (HOSPITAL_COMMUNITY): Payer: Medicare Other

## 2015-10-18 NOTE — Telephone Encounter (Signed)
Pt will come Friday 10/21/15 @ 1pm for 24 hour holter. Orders placed

## 2015-10-20 ENCOUNTER — Encounter (HOSPITAL_COMMUNITY)
Admission: RE | Admit: 2015-10-20 | Discharge: 2015-10-20 | Disposition: A | Discharge status: Home / Self Care | Payer: Medicare Other | Source: Ambulatory Visit | Attending: Family Medicine | Admitting: Family Medicine

## 2015-10-20 DIAGNOSIS — J8410 Pulmonary fibrosis, unspecified: Secondary | ICD-10-CM | POA: Diagnosis not present

## 2015-10-21 ENCOUNTER — Ambulatory Visit (INDEPENDENT_AMBULATORY_CARE_PROVIDER_SITE_OTHER): Payer: Medicare Other

## 2015-10-21 DIAGNOSIS — R001 Bradycardia, unspecified: Secondary | ICD-10-CM

## 2015-10-21 DIAGNOSIS — I4891 Unspecified atrial fibrillation: Secondary | ICD-10-CM

## 2015-10-21 NOTE — Patient Instructions (Signed)
24 hour holter monitor placed on pt with instruction to wear until tomorrow 10/22/15 @ 1:30PM. Pt voices understanding and will bring back on Monday

## 2015-10-25 ENCOUNTER — Encounter (HOSPITAL_COMMUNITY)
Admission: RE | Admit: 2015-10-25 | Discharge: 2015-10-25 | Disposition: A | Discharge status: Home / Self Care | Payer: Medicare Other | Source: Ambulatory Visit | Attending: Family Medicine | Admitting: Family Medicine

## 2015-10-25 DIAGNOSIS — J8410 Pulmonary fibrosis, unspecified: Secondary | ICD-10-CM | POA: Diagnosis not present

## 2015-10-27 ENCOUNTER — Encounter (HOSPITAL_COMMUNITY)
Admission: RE | Admit: 2015-10-27 | Discharge: 2015-10-27 | Disposition: A | Discharge status: Home / Self Care | Payer: Medicare Other | Source: Ambulatory Visit | Attending: Internal Medicine | Admitting: Internal Medicine

## 2015-10-27 DIAGNOSIS — J8410 Pulmonary fibrosis, unspecified: Secondary | ICD-10-CM | POA: Diagnosis not present

## 2015-10-27 DIAGNOSIS — J849 Interstitial pulmonary disease, unspecified: Secondary | ICD-10-CM | POA: Diagnosis not present

## 2015-11-01 ENCOUNTER — Encounter (HOSPITAL_COMMUNITY): Payer: Medicare Other

## 2015-11-02 ENCOUNTER — Telehealth: Payer: Self-pay | Admitting: *Deleted

## 2015-11-02 DIAGNOSIS — R001 Bradycardia, unspecified: Secondary | ICD-10-CM

## 2015-11-02 NOTE — Telephone Encounter (Signed)
-----   Message from Antoine PocheJonathan F Branch, MD sent at 11/02/2015  3:54 PM EST ----- Patient with low heart rates noted on monitor that could be causing her symptoms, She needs referall to EP for bradycardia  Dominga FerryJ Branch MD

## 2015-11-02 NOTE — Telephone Encounter (Signed)
No answer no VM. Orders placed for referral and routed to schedulers

## 2015-11-03 ENCOUNTER — Encounter (HOSPITAL_COMMUNITY)
Admission: RE | Admit: 2015-11-03 | Discharge: 2015-11-03 | Disposition: A | Discharge status: Home / Self Care | Payer: Medicare Other | Source: Ambulatory Visit | Attending: Internal Medicine | Admitting: Internal Medicine

## 2015-11-03 DIAGNOSIS — J8410 Pulmonary fibrosis, unspecified: Secondary | ICD-10-CM | POA: Diagnosis not present

## 2015-11-07 ENCOUNTER — Ambulatory Visit: Payer: Medicare Other | Admitting: Cardiology

## 2015-11-07 NOTE — Progress Notes (Unsigned)
Patient ID: Samantha Ryan, female   DOB: 1931/04/01, 82 y.o.   MRN: [id number]     Clinical Summary Samantha Ryan is a 82 y.o.female seen today for follow up of the following medical problems.    1. Afib  - denies any palpitations. - Has not required any AV nodal agents.  - on anticoag with xarelto. Denies any recent bleeding  2. Bradyardia - since last visit completed holter monitor that showed afib some bradycardia as low as 25 bpm and occasional pauses up to 3 seconds.   3. HTN  - compliant with meds  4. Pulmonary fibrosis - followed at [facility] - notes some worsening in her SOB/DOE, has f/u at [facility] coming up - using home O2 as needed.   Past Medical History  Diagnosis Date  . Diabetes mellitus   . Hypertension   . Hyperlipidemia   . Vitamin D insufficiency   . OA (osteoarthritis)   . Lactose intolerance   . Rheumatic fever   . Pulmonary fibrosis   . IBS (irritable bowel syndrome)   . History of colon polyps   . Atrial fibrillation      Allergies  Allergen Reactions  . Lactose Intolerance (Gi)     Can eat cheeses and small portions of milk.     Current Outpatient Prescriptions  Medication Sig Dispense Refill  . Acetylcysteine (NAC 600) 600 MG CAPS Take 1 capsule by mouth 3 (three) times daily.     Marland Kitchen amLODipine (NORVASC) 5 MG tablet Take 1 tablet (5 mg total) by mouth daily. 30 tablet 6  . Ascorbic Acid (VITAMIN C) 1000 MG tablet Take 1 tablet by mouth daily.    . chlorpheniramine (CHLOR-TRIMETON) 4 MG tablet Take 4 mg by mouth 2 (two) times daily as needed. For allergies    . Cholecalciferol (VITAMIN D-3 PO) Take 1,000 Units by mouth daily.     . Coenzyme Q10 (COQ10 PO) Take 1 tablet by mouth daily.     . Fenugreek 500 MG CAPS Take 1 capsule by mouth daily as needed.    . fish oil-omega-3 fatty acids 1000 MG capsule Take 2 g by mouth 2 (two) times a week.     . fluticasone (FLONASE) 50 MCG/ACT nasal spray Place 2 sprays into the nose daily as needed. For allergies      . furosemide (LASIX) 20 MG tablet TAKE 1 TAB DAILY AS NEEDED FOR SWELLING 90 tablet 3  . Ginger Root POWD Take 1 tablet by mouth daily.    Marland Kitchen guaiFENesin (MUCINEX) 600 MG 12 hr tablet Take 1,200 mg by mouth daily as needed. For allergies    . levothyroxine (SYNTHROID, LEVOTHROID) 25 MCG tablet Take 25 mcg by mouth daily.     Marland Kitchen loratadine (CLARITIN) 10 MG tablet Take 10 mg by mouth as needed for allergies.    Marland Kitchen losartan (COZAAR) 100 MG tablet Take 100 mg by mouth daily.    . Magnesium 250 MG TABS Take 250 mg by mouth daily.    . metFORMIN (GLUCOPHAGE) 500 MG tablet Take 500 mg by mouth daily with breakfast.     . Pectin Cit-Inos-C-Bioflav-Soy (MODIFIED CITRUS PECTIN PO) Take 2 capsules by mouth 3 (three) times daily.    . psyllium (METAMUCIL) 58.6 % powder Take 1 packet by mouth daily as needed.     . Red Yeast Rice 600 MG TABS Take 600 mg by mouth daily.    . rivaroxaban (XARELTO) 20 MG TABS tablet Take 1 tablet (20  mg total) by mouth daily. LOT [id number] EXP 6/18 20 tablet 0  . Turmeric 500 MG CAPS Take 1 tablet by mouth daily.     Samantha Ryan. XARELTO 20 MG TABS tablet TAKE 1 TABLET BY MOUTH EVERY DAY 30 tablet 3  . zolpidem (AMBIEN) 5 MG tablet Take 5-10 mg by mouth at bedtime as needed for sleep.     No current facility-administered medications for this visit.     Past Surgical History  Procedure Laterality Date  . Ovarian cyst surgery    . Appendectomy    . Tonsillectomy    . Cataract extraction    . Colonoscopy  05/13/2012    Procedure: COLONOSCOPY;  Surgeon: West BaliSandi L Fields, MD;  Location: [facility];  Service: Endoscopy;  Laterality: N/A;  9:30     Allergies  Allergen Reactions  . Lactose Intolerance (Gi)     Can eat cheeses and small portions of milk.      Family History  Problem Relation Age of Onset  . Stroke Father [id number]    Cause of Death  . Hypertension Father   . Breast cancer Mother 8561    Cause of death     Social History Samantha Ryan reports that she quit smoking about  80 years ago. Her smoking use included Cigarettes. She started smoking about 52 years ago. She has a 3.5 pack-year smoking history. She has never used smokeless tobacco. Samantha Ryan reports that she does not drink alcohol.   Review of Systems CONSTITUTIONAL: No weight loss, fever, chills, weakness or fatigue.  HEENT: Eyes: No visual loss, blurred vision, double vision or yellow sclerae.No hearing loss, sneezing, congestion, runny nose or sore throat.  SKIN: No rash or itching.  CARDIOVASCULAR:  RESPIRATORY: No shortness of breath, cough or sputum.  GASTROINTESTINAL: No anorexia, nausea, vomiting or diarrhea. No abdominal pain or blood.  GENITOURINARY: No burning on urination, no polyuria NEUROLOGICAL: No headache, dizziness, syncope, paralysis, ataxia, numbness or tingling in the extremities. No change in bowel or bladder control.  MUSCULOSKELETAL: No muscle, back pain, joint pain or stiffness.  LYMPHATICS: No enlarged nodes. No history of splenectomy.  PSYCHIATRIC: No history of depression or anxiety.  ENDOCRINOLOGIC: No reports of sweating, cold or heat intolerance. No polyuria or polydipsia.  Marland Kitchen.   Physical Examination There were no vitals filed for this visit. There were no vitals filed for this visit.  Gen: resting comfortably, no acute distress HEENT: no scleral icterus, pupils equal round and reactive, no palptable cervical adenopathy,  CV Resp: Clear to auscultation bilaterally GI: abdomen is soft, non-tender, non-distended, normal bowel sounds, no hepatosplenomegaly MSK: extremities are warm, no edema.  Skin: warm, no rash Neuro:  no focal deficits Psych: appropriate affect   Diagnostic Studies 03/2012 Echo  Study Conclusions  - Left ventricle: The cavity size was normal. Wall thickness was increased in a pattern of mild LVH. Systolic function was low normal. The estimated ejection fraction was in the range of 50% to 55%. Although no diagnostic regional wall motion  abnormality was identified, this possibility cannot be completely excluded on the basis of this study. The study is not technically sufficient to allow evaluation of LV diastolic function. - Aortic valve: Trileaflet; mildly thickened leaflets. Mean gradient: 6mm Hg (S). - Mitral valve: Mildly thickened leaflets . Mild regurgitation. - Left atrium: The atrium was mildly dilated. - Tricuspid valve: Mild-moderate regurgitation. Peak RV-RA gradient: 20mm Hg (S). - Pericardium, extracardiac: There was no pericardial effusion.  10/2014 echo Study  Conclusions  - Left ventricle: The cavity size was normal. Wall thickness was increased in a pattern of mild LVH. The estimated ejection fraction was 55%. Wall motion was normal; there were no regional wall motion abnormalities. Findings consistent with left ventricular diastolic dysfunction. Doppler parameters are consistent with high ventricular filling pressure. - Aortic valve: Sclerosis without stenosis. There was no significant regurgitation. - Mitral valve: There is mitral leaflet thickening. There is mild MR, but no MS. The valve does not have the classic appearance of a hockey puck deformity. - Left atrium: The atrium was mildly dilated. - Right ventricle: The cavity size was normal. Systolic function was mildly reduced. - Right atrium: The atrium was mildly dilated.    Assessment and Plan  1. Afib - denies any current symptoms, has not required AV nodal agents - continue current meds including xarelto for stroke prevention  2. HTN - at goal, continue current meds  3. Pulmonary fibrosis - followed by [facility] - chronic stable SOB       Antoine Poche, M.D., F.A.C.C.

## 2015-11-08 ENCOUNTER — Encounter: Payer: Self-pay | Admitting: *Deleted

## 2015-11-08 ENCOUNTER — Encounter (HOSPITAL_COMMUNITY): Payer: Medicare Other

## 2015-11-08 NOTE — Telephone Encounter (Signed)
No answer no VM X 2 days, pt was NS show for 11/07/15 appt. Appt scheduled for EP. Will mail pt letter.

## 2015-11-10 ENCOUNTER — Encounter (HOSPITAL_COMMUNITY)
Admission: RE | Admit: 2015-11-10 | Discharge: 2015-11-10 | Disposition: A | Discharge status: Home / Self Care | Payer: Medicare Other | Source: Ambulatory Visit | Attending: Family Medicine | Admitting: Family Medicine

## 2015-11-10 DIAGNOSIS — J8410 Pulmonary fibrosis, unspecified: Secondary | ICD-10-CM | POA: Diagnosis not present

## 2015-11-15 ENCOUNTER — Encounter (HOSPITAL_COMMUNITY)
Admission: RE | Admit: 2015-11-15 | Discharge: 2015-11-15 | Disposition: A | Discharge status: Home / Self Care | Payer: Medicare Other | Source: Ambulatory Visit | Attending: Family Medicine | Admitting: Family Medicine

## 2015-11-15 DIAGNOSIS — J8410 Pulmonary fibrosis, unspecified: Secondary | ICD-10-CM | POA: Diagnosis not present

## 2015-11-16 ENCOUNTER — Institutional Professional Consult (permissible substitution): Payer: Medicare Other | Admitting: Internal Medicine

## 2015-11-17 ENCOUNTER — Encounter (HOSPITAL_COMMUNITY)
Admission: RE | Admit: 2015-11-17 | Discharge: 2015-11-17 | Disposition: A | Discharge status: Home / Self Care | Payer: Medicare Other | Source: Ambulatory Visit | Attending: Family Medicine | Admitting: Family Medicine

## 2015-11-17 DIAGNOSIS — J8410 Pulmonary fibrosis, unspecified: Secondary | ICD-10-CM | POA: Diagnosis not present

## 2015-11-22 ENCOUNTER — Encounter (HOSPITAL_COMMUNITY)
Admission: RE | Admit: 2015-11-22 | Discharge: 2015-11-22 | Disposition: A | Discharge status: Home / Self Care | Payer: Medicare Other | Source: Ambulatory Visit | Attending: Family Medicine | Admitting: Family Medicine

## 2015-11-22 DIAGNOSIS — J8410 Pulmonary fibrosis, unspecified: Secondary | ICD-10-CM | POA: Diagnosis not present

## 2015-11-24 ENCOUNTER — Encounter (HOSPITAL_COMMUNITY)
Admission: RE | Admit: 2015-11-24 | Discharge: 2015-11-24 | Disposition: A | Discharge status: Home / Self Care | Payer: Medicare Other | Source: Ambulatory Visit | Attending: Family Medicine | Admitting: Family Medicine

## 2015-11-24 DIAGNOSIS — J8410 Pulmonary fibrosis, unspecified: Secondary | ICD-10-CM | POA: Diagnosis not present

## 2015-12-01 ENCOUNTER — Encounter (HOSPITAL_COMMUNITY)
Admission: RE | Admit: 2015-12-01 | Discharge: 2015-12-01 | Disposition: A | Discharge status: Home / Self Care | Payer: Medicare Other | Source: Ambulatory Visit | Attending: Internal Medicine | Admitting: Internal Medicine

## 2015-12-01 DIAGNOSIS — J8410 Pulmonary fibrosis, unspecified: Secondary | ICD-10-CM | POA: Insufficient documentation

## 2015-12-01 DIAGNOSIS — J849 Interstitial pulmonary disease, unspecified: Secondary | ICD-10-CM | POA: Diagnosis not present

## 2015-12-07 NOTE — Progress Notes (Signed)
Pulmonary Rehabilitation Program Outcomes Report   Orientation:  06/16/15 Graduate Date:  12/01/15 Discharge Date:  12/01/15 # of sessions completed: 36  Pulmonologist: Burdine Family MD:  burdine Class Time:  1330  A.  Exercise Program:  Tolerates exercise @ 3.53 METS for 153 minutes, Walk Test Results:  Post: 2.96, Improved functional capacity  35.45 %, Improved  muscular strength  8.11 %, No Change dyspnea score 0 %, Improved education score 9.09 %, Exercise limited by dyspnea and Progressed to Phase 4 maintenance program  B.  Mental Health:  Good mental attitude and PHQ-9: 3. entrance was 7  C.  Education/Instruction/Skills  Uses Perceived Exertion Scale and/or Dyspnea Scale  Demonstrates accurate pursed lip breathing  D.  Nutrition/Weight Control/Body Composition:  Adherence to prescribed nutrition program: fair  and Patient has lost 1.2 kg   E.  Blood Lipids    Lab Results  Component Value Date   CHOL 262* 03/12/2012   TRIG 197 03/12/2012    F.  Lifestyle Changes:  Making positive lifestyle changes and Not smoking:  Quit 1973  G.  Symptoms noted with exercise:  Asymptomatic  Report Completed By:  Doretha Sou Theia Dezeeuw RN   Comments:  This is the patients graduation note for [facility].  Patient has done well in the program.  Patient plans to join the maintenance program.

## 2015-12-07 NOTE — Progress Notes (Signed)
Patient is discharged from [facility] Pulmonary program today, 12/01/15 with 36 sessions.  She achieved LTG of 30 minutes of aerobic exercise at max met level of 3.53.  Patient has not met with dietician.  Discharge instructions have been reviewed in detail and patient expressed an understanding of material given.  Patient plans to exercise at home and join the maintenance program. Cardiac Rehab will make 1 month, 6 month and 1 year call backs.  Patient had no complaints of any abnormal S/S or pain on their exit visit.

## 2015-12-15 ENCOUNTER — Institutional Professional Consult (permissible substitution): Payer: Medicare Other | Admitting: Internal Medicine

## 2016-07-10 ENCOUNTER — Other Ambulatory Visit: Payer: Self-pay | Admitting: Cardiology

## 2016-08-09 ENCOUNTER — Other Ambulatory Visit: Payer: Self-pay | Admitting: Cardiology

## 2016-08-09 ENCOUNTER — Other Ambulatory Visit: Payer: Self-pay | Admitting: *Deleted

## 2016-08-09 MED ORDER — FUROSEMIDE 20 MG PO TABS: ORAL_TABLET | ORAL | 0 refills | Status: DC

## 2016-09-03 ENCOUNTER — Telehealth: Payer: Self-pay | Admitting: Cardiology

## 2016-09-03 DIAGNOSIS — Z7901 Long term (current) use of anticoagulants: Secondary | ICD-10-CM

## 2016-09-03 DIAGNOSIS — R040 Epistaxis: Secondary | ICD-10-CM

## 2016-09-03 NOTE — Telephone Encounter (Signed)
Mrs. Samantha Ryan called stating that she is on Xarelto and she continues to have nose bleeds.

## 2016-09-03 NOTE — Telephone Encounter (Signed)
Having nose bleeds daily with nose bleeds now x 1 month.  Said that pmd checked her 2 weeks ago & did say that one side was inflamed.  Stated that she has been taking Xarelto every other day x 4 days now.  Does not see much difference.  States usually last 20 minutes or so.  PMD suggested irrigation with saline solution.  Has f/u in March.

## 2016-09-04 NOTE — Telephone Encounter (Signed)
Did her primary check any labs? If not please obtain a bmet, cbc, INR. Can hold xarelto for 1 week then try resuming and keep us updated  Dominga FerryJ Jezreel Justiniano MD

## 2016-09-04 NOTE — Telephone Encounter (Signed)
Patient notified.  She will have her daughter pick up her lab orders & she will do them at Dr. Cato MulliganBurdine's office.  She has not taken her Xarelto yet for today, so will have her hold beginning today thru 09/11/2016.  She will try to resume afterwards and keep us updated on how she is doing.    Patient does wear oxygen (nasal cannula) all the time, but it is humidified.  Stated she has never had this problem before.

## 2016-09-04 NOTE — Addendum Note (Signed)
Addended by: Lesle ChrisHILL, ANGELA G on: 09/04/2016 04:52 PM   Modules accepted: Orders

## 2016-09-06 LAB — PROTIME-INR

## 2016-09-11 ENCOUNTER — Telehealth: Payer: Self-pay | Admitting: Cardiology

## 2016-09-11 DIAGNOSIS — R040 Epistaxis: Secondary | ICD-10-CM

## 2016-09-11 NOTE — Telephone Encounter (Signed)
Routed to Dr. Wyline MoodBranch - labs scanned into chart

## 2016-09-11 NOTE — Telephone Encounter (Signed)
Pt has been off of Xarelto and having bloody dry scabs coming out of nose - says Dr. Leandrew KoyanagiBurdine gave her Flonase and saline thought oxygen was drying nose out - pt says that was 2 weeks ago and now thinks she needs referral to ENT

## 2016-09-11 NOTE — Telephone Encounter (Signed)
Labs look good. Have her nose bleeds stopped? Is she still off xarelto?   J BrancH MD

## 2016-09-11 NOTE — Telephone Encounter (Signed)
Results of lab work / tg  °

## 2016-09-13 NOTE — Telephone Encounter (Signed)
Can we place referral to ENT for nose bleeds, continue to hold Marthenia Rollingxarelto   J BrancH MD

## 2016-09-13 NOTE — Telephone Encounter (Signed)
LM for pt that we would place referral and to continue to hold Xarelto - placed orders and forwarded to schedulers

## 2016-10-01 ENCOUNTER — Ambulatory Visit (INDEPENDENT_AMBULATORY_CARE_PROVIDER_SITE_OTHER): Payer: Medicare Other | Admitting: Otolaryngology

## 2016-10-01 DIAGNOSIS — J310 Chronic rhinitis: Secondary | ICD-10-CM | POA: Diagnosis not present

## 2016-10-01 DIAGNOSIS — R040 Epistaxis: Secondary | ICD-10-CM

## 2016-10-21 ENCOUNTER — Encounter (HOSPITAL_COMMUNITY): Payer: Self-pay | Admitting: Emergency Medicine

## 2016-10-21 ENCOUNTER — Emergency Department (HOSPITAL_COMMUNITY)
Admission: EM | Admit: 2016-10-21 | Discharge: 2016-10-21 | Disposition: A | Discharge status: Home / Self Care | Payer: Medicare Other | Attending: Emergency Medicine | Admitting: Emergency Medicine

## 2016-10-21 DIAGNOSIS — I10 Essential (primary) hypertension: Secondary | ICD-10-CM | POA: Diagnosis not present

## 2016-10-21 DIAGNOSIS — E119 Type 2 diabetes mellitus without complications: Secondary | ICD-10-CM | POA: Diagnosis not present

## 2016-10-21 DIAGNOSIS — Z7984 Long term (current) use of oral hypoglycemic drugs: Secondary | ICD-10-CM | POA: Diagnosis not present

## 2016-10-21 DIAGNOSIS — Z87891 Personal history of nicotine dependence: Secondary | ICD-10-CM | POA: Diagnosis not present

## 2016-10-21 DIAGNOSIS — R040 Epistaxis: Secondary | ICD-10-CM | POA: Diagnosis not present

## 2016-10-21 DIAGNOSIS — Z79899 Other long term (current) drug therapy: Secondary | ICD-10-CM | POA: Insufficient documentation

## 2016-10-21 LAB — I-STAT CHEM 8, ED
BUN: 40 mg/dL — ABNORMAL HIGH (ref 6–20)
CALCIUM ION: 0.87 mmol/L — AB (ref 1.15–1.40)
Chloride: 99 mmol/L — ABNORMAL LOW (ref 101–111)
Creatinine, Ser: 1.2 mg/dL — ABNORMAL HIGH (ref 0.44–1.00)
GLUCOSE: 109 mg/dL — AB (ref 65–99)
HCT: 44 % (ref 36.0–46.0)
Hemoglobin: 15 g/dL (ref 12.0–15.0)
Potassium: 4.5 mmol/L (ref 3.5–5.1)
Sodium: 134 mmol/L — ABNORMAL LOW (ref 135–145)
TCO2: 29 mmol/L (ref 0–100)

## 2016-10-21 MED ORDER — OXYMETAZOLINE HCL 0.05 % NA SOLN
NASAL | Status: AC
  Administered 2016-10-21: 2
  Filled 2016-10-21: qty 15

## 2016-10-21 NOTE — ED Notes (Signed)
No active bleeding noted at this time

## 2016-10-21 NOTE — ED Notes (Signed)
Pt has chronic and progressive lung dz, states she wears 8L [place] at home and O2 sat normally is in low to mid 80s while sitting.

## 2016-10-21 NOTE — ED Provider Notes (Signed)
[facility]-EM[facility]Y [facility]PT Provider Note   CSN: [id number] Arrival date & time: 10/21/16  1059   By signing my name below, I, Bobbie Stack, attest that this documentation has been prepared under the direction and in the presence of Bethann Berkshire, MD. Electronically Signed: Bobbie Stack, Scribe. 10/21/16. 11:23 AM. History   Chief Complaint Chief Complaint  Patient presents with  . Epistaxis    LEVEL 5 CAVEAT: Severe Nosebleed HPI Comments: Samantha Ryan is a 83 y.o. female brought in by ambulance, who presents to the Emergency Department complaining of epistaxis that began around 0900 this morning. Per EMS: The patient attempted to control the nosebleed for an hour before calling ems. After EMS arrived they attempted to stop the nosebleed with pressure for 30 minutes before taking her to ED for evaluation. They reports being able to stop the left nostril from bleed but the right nostril continued to pour blood. They noted that her BP was 130/70. The patient states that she used to be on Xarelto but it was recently stopped in December. She also states that she takes lasix every other day. The patient has a hx of nosebleeds.  The history is provided by the patient and the EMS personnel. No language interpreter was used.  Epistaxis   This is a new problem. The current episode started 1 to 2 hours ago. The problem occurs constantly. The problem has been gradually worsening. The problem is associated with an unknown factor. The bleeding has been from both nares. She has tried applying pressure for the symptoms. The treatment provided no relief.    Past Medical History:  Diagnosis Date  . Atrial fibrillation (HCC)   . Diabetes mellitus   . History of colon polyps   . Hyperlipidemia   . Hypertension   . IBS (irritable bowel syndrome)   . Lactose intolerance   . OA (osteoarthritis)   . Pulmonary fibrosis (HCC)   . Rheumatic fever   . Vitamin D insufficiency     Patient Active Problem List     Diagnosis Date Noted  . Atrial fibrillation (HCC) 03/31/2012  . History of colon polyps   . Diarrhea 03/25/2012  . DM 02/13/2010  . OTHER SPECIFIED DISORDERS OF ADRENAL GLANDS 02/13/2010  . HYPERTENSION 02/13/2010  . AV FISTULA 02/13/2010  . PALPITATIONS 02/13/2010  . DYSPNEA 02/13/2010  . PRECORDIAL PAIN 02/13/2010    Past Surgical History:  Procedure Laterality Date  . APPENDECTOMY    . CATARACT EXTRACTION    . COLONOSCOPY  05/13/2012   Procedure: COLONOSCOPY;  Surgeon: West Bali, MD;  Location: [facility];  Service: Endoscopy;  Laterality: N/A;  9:30  . OVARIAN CYST SURGERY    . TONSILLECTOMY      OB History    No data available       Home Medications    Prior to Admission medications   Medication Sig Start Date End Date Taking? Authorizing Provider  Acetylcysteine (NAC 600) 600 MG CAPS Take 1 capsule by mouth 3 (three) times daily.     Historical Provider, MD  amLODipine (NORVASC) 5 MG tablet Take 1 tablet (5 mg total) by mouth daily. 03/31/12   Rollene Rotunda, MD  Ascorbic Acid (VITAMIN C) 1000 MG tablet Take 1 tablet by mouth daily.    Historical Provider, MD  chlorpheniramine (CHLOR-TRIMETON) 4 MG tablet Take 4 mg by mouth 2 (two) times daily as needed. For allergies    Historical Provider, MD  Cholecalciferol (VITAMIN D-3 PO) Take 1,000 Units  by mouth daily.     Historical Provider, MD  Coenzyme Q10 (COQ10 PO) Take 1 tablet by mouth daily.     Historical Provider, MD  Fenugreek 500 MG CAPS Take 1 capsule by mouth daily as needed.    Historical Provider, MD  fish oil-omega-3 fatty acids 1000 MG capsule Take 2 g by mouth 2 (two) times a week.     Historical Provider, MD  fluticasone (FLONASE) 50 MCG/ACT nasal spray Place 2 sprays into the nose daily as needed. For allergies    Historical Provider, MD  furosemide (LASIX) 20 MG tablet TAKE 1 TABLET BY MOUTH DAILY AS NEEDED FOR SWELLING. 08/09/16   Antoine Poche, MD  Ginger Root POWD Take 1 tablet by mouth  daily.    Historical Provider, MD  guaiFENesin (MUCINEX) 600 MG 12 hr tablet Take 1,200 mg by mouth daily as needed. For allergies    Historical Provider, MD  levothyroxine (SYNTHROID, LEVOTHROID) 25 MCG tablet Take 25 mcg by mouth daily.  03/24/12   Historical Provider, MD  loratadine (CLARITIN) 10 MG tablet Take 10 mg by mouth as needed for allergies.    Historical Provider, MD  losartan (COZAAR) 100 MG tablet Take 100 mg by mouth daily.    Historical Provider, MD  Magnesium 250 MG TABS Take 250 mg by mouth daily.    Historical Provider, MD  metFORMIN (GLUCOPHAGE) 500 MG tablet Take 500 mg by mouth daily with breakfast.     Historical Provider, MD  Pectin Cit-Inos-C-Bioflav-Soy (MODIFIED CITRUS PECTIN PO) Take 2 capsules by mouth 3 (three) times daily.    Historical Provider, MD  psyllium (METAMUCIL) 58.6 % powder Take 1 packet by mouth daily as needed.     Historical Provider, MD  Red Yeast Rice 600 MG TABS Take 600 mg by mouth daily.    Historical Provider, MD  rivaroxaban (XARELTO) 20 MG TABS tablet Take 1 tablet (20 mg total) by mouth daily. LOT [id number] EXP 6/18 08/09/15   Antoine Poche, MD  Turmeric 500 MG CAPS Take 1 tablet by mouth daily.     Historical Provider, MD  XARELTO 20 MG TABS tablet TAKE 1 TABLET BY MOUTH EVERY DAY 09/27/15   Antoine Poche, MD  zolpidem (AMBIEN) 5 MG tablet Take 5-10 mg by mouth at bedtime as needed for sleep.    Historical Provider, MD    Family History Family History  Problem Relation Age of Onset  . Breast cancer Mother 82    Cause of death  . Stroke Father 24    Cause of Death  . Hypertension Father     Social History Social History  Substance Use Topics  . Smoking status: Former Smoker    Packs/day: 0.50    Years: 7.00    Types: Cigarettes    Start date: 08/28/1963    Quit date: 08/27/1970  . Smokeless tobacco: Never Used  . Alcohol use No     Allergies   Crestor [rosuvastatin calcium]; Lactose intolerance (gi); and Levaquin  [levofloxacin in d5w]   Review of Systems Review of Systems  Unable to perform ROS: Acuity of condition (NoseBleed)     Physical Exam Updated Vital Signs BP 115/73 (BP Location: Right Arm)   Pulse 86   Resp 26   SpO2 (!) 76%   Physical Exam  Constitutional: She is oriented to person, place, and time.  Cachectic. Dyspneic.  HENT:  Head: Normocephalic.  Blood in right nostril. Blood in posterior pharynx.  Eyes:  Conjunctivae and EOM are normal. No scleral icterus.  Neck: Neck supple. No thyromegaly present.  Cardiovascular: Normal rate and regular rhythm.  Exam reveals no gallop and no friction rub.   No murmur heard. Pulmonary/Chest: No stridor. She has wheezes. She has no rales. She exhibits no tenderness.  Mild wheezing bilaterally  Abdominal: She exhibits no distension. There is no tenderness. There is no rebound.  Musculoskeletal: Normal range of motion. She exhibits no edema.  Lymphadenopathy:    She has no cervical adenopathy.  Neurological: She is oriented to person, place, and time. She exhibits normal muscle tone. Coordination normal.  Skin: No rash noted. No erythema.  Psychiatric: She has a normal mood and affect. Her behavior is normal.     ED Treatments / Results  DIAGNOSTIC STUDIES: Oxygen Saturation is 76% on Blow-by, low by my interpretation.    COORDINATION OF CARE: 11:11 AM Discussed treatment plan with pt at bedside, which includes treatment options for stopping her nosebleed, and pt agreed to plan.  Labs (all labs ordered are listed, but only abnormal results are displayed) Labs Reviewed - No data to display  EKG  EKG Interpretation None       Radiology No results found.  Procedures Procedures (including critical care time)  Medications Ordered in ED Medications  oxymetazoline (AFRIN) 0.05 % nasal spray (not administered)     Initial Impression / Assessment and Plan / ED Course  I have reviewed the triage vital signs and the  nursing notes.  Pertinent labs & imaging results that were available during my care of the patient were reviewed by me and considered in my medical decision making (see chart for details).     Patient had a significant nosebleed the right posterolateral which has now stopped with Afrin and pressure. The patient has severe COPD and she is on 8 L at home and her sats usually run in the 80s. The patient was on 8 L nasal here but the nasal cannula was in her mouth and she Her sats at her normal level in the 80s. She will be discharged home and told to put the nasal cannula in her mouth if she cannot keep her sats up with keeping it in her nose. Patient will follow-up with her ENT doctor  Final Clinical Impressions(s) / ED Diagnoses   Final diagnoses:  None    New Prescriptions New Prescriptions   No medications on file   The chart was scribed for me under my direct supervision.  I personally performed the history, physical, and medical decision making and all procedures in the evaluation of this patient.Bethann Berkshire.    Zaheer Wageman, MD 10/21/16 [phone]

## 2016-10-21 NOTE — Discharge Instructions (Signed)
Follow-up with your ENT doctor this week. Return if bleeding starts back up again.

## 2016-10-21 NOTE — ED Triage Notes (Signed)
Pt reports epistaxis beginning around 0900 this morning.  Continues to bleed on arrival.  PT is on 8L [place] normally.

## 2016-10-25 ENCOUNTER — Encounter: Payer: Self-pay | Admitting: Cardiology

## 2016-10-25 ENCOUNTER — Ambulatory Visit (INDEPENDENT_AMBULATORY_CARE_PROVIDER_SITE_OTHER): Payer: Medicare Other | Admitting: Otolaryngology

## 2016-10-25 ENCOUNTER — Ambulatory Visit (INDEPENDENT_AMBULATORY_CARE_PROVIDER_SITE_OTHER): Payer: Medicare Other | Admitting: Cardiology

## 2016-10-25 VITALS — BP 116/79 | HR 92 | Ht 66.0 in | Wt 165.0 lb

## 2016-10-25 DIAGNOSIS — I4891 Unspecified atrial fibrillation: Secondary | ICD-10-CM

## 2016-10-25 DIAGNOSIS — I10 Essential (primary) hypertension: Secondary | ICD-10-CM | POA: Diagnosis not present

## 2016-10-25 DIAGNOSIS — R001 Bradycardia, unspecified: Secondary | ICD-10-CM

## 2016-10-25 DIAGNOSIS — R040 Epistaxis: Secondary | ICD-10-CM

## 2016-10-25 MED ORDER — FUROSEMIDE 40 MG PO TABS: ORAL_TABLET | ORAL | 1 refills | Status: DC

## 2016-10-25 NOTE — Patient Instructions (Signed)
Your physician recommends that you schedule a follow-up appointment in: 4 MONTHS [facility]  Your physician has recommended you make the following change in your medication:   CHANGE LASIX 40 MG DAILY AS NEEDED   PLEASE UPDATE US ON Monday WITH WEIGHTS AND SWELLING  Your physician recommends that you return for lab work in: WEEK AND HALF BMP/MG  [facility]

## 2016-10-25 NOTE — Progress Notes (Signed)
Clinical Summary Samantha Ryan is a 83 y.o.female seen today for follow up of the following medical problems.    1. Afib  - denies any palpitations. - Has not required any AV nodal agents.  - she has been off xarelto due to severe recurrent nose bleeds.  - no palpitations.   2. Samantha Ryan -  completed holter monitor that showed afib some bradycardia as low as 25 bpm and occasional pauses up to 3 seconds.  - we had worked to get a referral to EP however were unable to contact patient  - no recent dizziness. Due to her severe lung disease over the last few months has essentially been wheel chair dependent.   3. HTN  - she is compliant with meds  4. Pulmonary fibrosis - [facility] - symptoms have severely progressed - last notes from [facility] mention at least a brief discussion about hospice as her symptoms are progressing.  - 03/2016 6 minute walk O2 down to 88% on 15 liters high flow [place]. Room air O2 sat 80%.   5. Nose bleeds - we referred to ENT. ER visit 10/01/16 with recurrent nosebleed - anticoag had been held Past Medical History:  Diagnosis Date  . Atrial fibrillation (HCC)   . Diabetes mellitus   . History of colon polyps   . Hyperlipidemia   . Hypertension   . IBS (irritable bowel syndrome)   . Lactose intolerance   . OA (osteoarthritis)   . Pulmonary fibrosis (HCC)   . Rheumatic fever   . Vitamin D insufficiency      Allergies  Allergen Reactions  . Crestor [Rosuvastatin Calcium] Rash  . Lactose Intolerance (Gi)     Can eat cheeses and small portions of milk.  Barbera Setters. Levaquin [Levofloxacin In D5w] Rash     Current Outpatient Prescriptions  Medication Sig Dispense Refill  . Acetylcysteine (NAC 600) 600 MG CAPS Take 1 capsule by mouth 3 (three) times daily.     Marland Kitchen. amLODipine (NORVASC) 5 MG tablet Take 1 tablet (5 mg total) by mouth daily. 30 tablet 6  . Ascorbic Acid (VITAMIN C) 1000 MG tablet Take 1 tablet by mouth daily.    . chlorpheniramine  (CHLOR-TRIMETON) 4 MG tablet Take 4 mg by mouth 2 (two) times daily as needed. For allergies    . Cholecalciferol (VITAMIN D-3 PO) Take 1,000 Units by mouth daily.     . Coenzyme Q10 (COQ10 PO) Take 1 tablet by mouth daily.     . Fenugreek 500 MG CAPS Take 1 capsule by mouth daily as needed.    . fish oil-omega-3 fatty acids 1000 MG capsule Take 2 g by mouth 2 (two) times a week.     . fluticasone (FLONASE) 50 MCG/ACT nasal spray Place 2 sprays into the nose daily as needed. For allergies    . furosemide (LASIX) 20 MG tablet TAKE 1 TABLET BY MOUTH DAILY AS NEEDED FOR SWELLING. 15 tablet 0  . Ginger Root POWD Take 1 tablet by mouth daily.    Marland Kitchen. guaiFENesin (MUCINEX) 600 MG 12 hr tablet Take 1,200 mg by mouth daily as needed. For allergies    . levothyroxine (SYNTHROID, LEVOTHROID) 25 MCG tablet Take 25 mcg by mouth daily.     Marland Kitchen. loratadine (CLARITIN) 10 MG tablet Take 10 mg by mouth as needed for allergies.    Marland Kitchen. losartan (COZAAR) 100 MG tablet Take 100 mg by mouth daily.    . Magnesium 250 MG TABS Take  250 mg by mouth daily.    . metFORMIN (GLUCOPHAGE) 500 MG tablet Take 500 mg by mouth daily with breakfast.     . Pectin Cit-Inos-C-Bioflav-Soy (MODIFIED CITRUS PECTIN PO) Take 2 capsules by mouth 3 (three) times daily.    . psyllium (METAMUCIL) 58.6 % powder Take 1 packet by mouth daily as needed.     . Red Yeast Rice 600 MG TABS Take 600 mg by mouth daily.    . rivaroxaban (XARELTO) 20 MG TABS tablet Take 1 tablet (20 mg total) by mouth daily. LOT [id number] EXP 6/18 20 tablet 0  . Turmeric 500 MG CAPS Take 1 tablet by mouth daily.     Carlena Hurl 20 MG TABS tablet TAKE 1 TABLET BY MOUTH EVERY DAY 30 tablet 3  . zolpidem (AMBIEN) 5 MG tablet Take 5-10 mg by mouth at bedtime as needed for sleep.     No current facility-administered medications for this visit.      Past Surgical History:  Procedure Laterality Date  . APPENDECTOMY    . CATARACT EXTRACTION    . COLONOSCOPY  05/13/2012   Procedure:  COLONOSCOPY;  Surgeon: West Bali, MD;  Location: [facility];  Service: Endoscopy;  Laterality: N/A;  9:30  . OVARIAN CYST SURGERY    . TONSILLECTOMY       Allergies  Allergen Reactions  . Crestor [Rosuvastatin Calcium] Rash  . Lactose Intolerance (Gi)     Can eat cheeses and small portions of milk.  Barbera Setters [Levofloxacin In D5w] Rash      Family History  Problem Relation Age of Onset  . Breast cancer Mother 75    Cause of death  . Stroke Father 25    Cause of Death  . Hypertension Father      Social History Samantha Ryan reports that she quit smoking about 46 years ago. Her smoking use included Cigarettes. She started smoking about 53 years ago. She has a 3.50 pack-year smoking history. She has never used smokeless tobacco. Samantha Ryan reports that she does not drink alcohol.   Review of Systems CONSTITUTIONAL: No weight loss, fever, chills, weakness or fatigue.  HEENT: Eyes: No visual loss, blurred vision, double vision or yellow sclerae.No hearing loss, sneezing, congestion, runny nose or sore throat.  SKIN: No rash or itching.  CARDIOVASCULAR: per HPI RESPIRATORY: +SOB  GASTROINTESTINAL: No anorexia, nausea, vomiting or diarrhea. No abdominal pain or blood.  GENITOURINARY: No burning on urination, no polyuria NEUROLOGICAL: No headache, dizziness, syncope, paralysis, ataxia, numbness or tingling in the extremities. No change in bowel or bladder control.  MUSCULOSKELETAL: No muscle, back pain, joint pain or stiffness.  LYMPHATICS: No enlarged nodes. No history of splenectomy.  PSYCHIATRIC: No history of depression or anxiety.  ENDOCRINOLOGIC: No reports of sweating, cold or heat intolerance. No polyuria or polydipsia.  Marland Kitchen   Physical Examination Vitals:   10/25/16 1124  BP: 116/79  Pulse: 92   Vitals:   10/25/16 1124  Weight: 165 lb (74.8 kg)  Height: 5\' 6"  (1.676 m)    Gen: resting comfortably, no acute distress HEENT: no scleral icterus, pupils equal  round and reactive, no palptable cervical adenopathy,  CV: RRR, no m/r/g, no jvd Resp: bilateral dry crackles GI: abdomen is soft, non-tender, non-distended, normal bowel sounds, no hepatosplenomegaly MSK: extremities are warm, no edema.  Skin: warm, no rash Neuro:  no focal deficits Psych: appropriate affect   Diagnostic Studies  03/2012 Echo  Study Conclusions  - Left  ventricle: The cavity size was normal. Wall thickness was increased in a pattern of mild LVH. Systolic function was low normal. The estimated ejection fraction was in the range of 50% to 55%. Although no diagnostic regional wall motion abnormality was identified, this possibility cannot be completely excluded on the basis of this study. The study is not technically sufficient to allow evaluation of LV diastolic function. - Aortic valve: Trileaflet; mildly thickened leaflets. Mean gradient: 6mm Hg (S). - Mitral valve: Mildly thickened leaflets . Mild regurgitation. - Left atrium: The atrium was mildly dilated. - Tricuspid valve: Mild-moderate regurgitation. Peak RV-RA gradient: 20mm Hg (S). - Pericardium, extracardiac: There was no pericardial effusion.  10/2014 echo Study Conclusions  - Left ventricle: The cavity size was normal. Wall thickness was increased in a pattern of mild LVH. The estimated ejection fraction was 55%. Wall motion was normal; there were no regional wall motion abnormalities. Findings consistent with left ventricular diastolic dysfunction. Doppler parameters are consistent with high ventricular filling pressure. - Aortic valve: Sclerosis without stenosis. There was no significant regurgitation. - Mitral valve: There is mitral leaflet thickening. There is mild MR, but no MS. The valve does not have the classic appearance of a hockey puck deformity. - Left atrium: The atrium was mildly dilated. - Right ventricle: The cavity size was normal. Systolic function was  mildly reduced. - Right atrium: The atrium was mildly dilated.   Assessment and Plan   1. Afib - denies any current symptoms, has not required AV nodal agents. - off anticoag due to severe recurrent nose bleeds, continue to stay off at this time  2. Bradycardia - monitor with episodes of severe bradycardia - due to her severe lung disease she is not a candidate for pacemaker consideration.    3. HTN - she is at goal, continue current meds  4. Pulmonary fibrosis - followed by [facility] - appears her symptoms have severely progressed      Antoine Poche, M.D.

## 2016-10-29 ENCOUNTER — Other Ambulatory Visit: Payer: Self-pay | Admitting: *Deleted

## 2016-10-29 MED ORDER — FUROSEMIDE 40 MG PO TABS: ORAL_TABLET | ORAL | 1 refills | Status: AC

## 2016-11-07 ENCOUNTER — Inpatient Hospital Stay (HOSPITAL_COMMUNITY)
Admission: EM | Admit: 2016-11-07 | Discharge: 2016-11-25 | DRG: 291 | Disposition: E | Payer: Medicare Other | Attending: Family Medicine | Admitting: Family Medicine

## 2016-11-07 ENCOUNTER — Telehealth: Payer: Self-pay | Admitting: Cardiology

## 2016-11-07 ENCOUNTER — Encounter (HOSPITAL_COMMUNITY): Payer: Self-pay | Admitting: Emergency Medicine

## 2016-11-07 ENCOUNTER — Ambulatory Visit (INDEPENDENT_AMBULATORY_CARE_PROVIDER_SITE_OTHER): Payer: Medicare Other | Admitting: Cardiology

## 2016-11-07 ENCOUNTER — Encounter: Payer: Self-pay | Admitting: Cardiology

## 2016-11-07 ENCOUNTER — Emergency Department (HOSPITAL_COMMUNITY): Payer: Medicare Other

## 2016-11-07 VITALS — BP 118/80 | HR 99 | Ht 63.0 in | Wt 181.0 lb

## 2016-11-07 DIAGNOSIS — Z7951 Long term (current) use of inhaled steroids: Secondary | ICD-10-CM

## 2016-11-07 DIAGNOSIS — Z7984 Long term (current) use of oral hypoglycemic drugs: Secondary | ICD-10-CM

## 2016-11-07 DIAGNOSIS — Z9981 Dependence on supplemental oxygen: Secondary | ICD-10-CM

## 2016-11-07 DIAGNOSIS — J8410 Pulmonary fibrosis, unspecified: Secondary | ICD-10-CM | POA: Diagnosis not present

## 2016-11-07 DIAGNOSIS — Z87891 Personal history of nicotine dependence: Secondary | ICD-10-CM

## 2016-11-07 DIAGNOSIS — I50811 Acute right heart failure: Secondary | ICD-10-CM | POA: Diagnosis not present

## 2016-11-07 DIAGNOSIS — E1122 Type 2 diabetes mellitus with diabetic chronic kidney disease: Secondary | ICD-10-CM | POA: Diagnosis present

## 2016-11-07 DIAGNOSIS — Z86010 Personal history of colonic polyps: Secondary | ICD-10-CM

## 2016-11-07 DIAGNOSIS — I519 Heart disease, unspecified: Secondary | ICD-10-CM

## 2016-11-07 DIAGNOSIS — J84112 Idiopathic pulmonary fibrosis: Secondary | ICD-10-CM | POA: Diagnosis present

## 2016-11-07 DIAGNOSIS — I4891 Unspecified atrial fibrillation: Secondary | ICD-10-CM

## 2016-11-07 DIAGNOSIS — J9621 Acute and chronic respiratory failure with hypoxia: Secondary | ICD-10-CM | POA: Diagnosis present

## 2016-11-07 DIAGNOSIS — I5033 Acute on chronic diastolic (congestive) heart failure: Secondary | ICD-10-CM

## 2016-11-07 DIAGNOSIS — E785 Hyperlipidemia, unspecified: Secondary | ICD-10-CM | POA: Diagnosis present

## 2016-11-07 DIAGNOSIS — I509 Heart failure, unspecified: Secondary | ICD-10-CM

## 2016-11-07 DIAGNOSIS — I482 Chronic atrial fibrillation: Secondary | ICD-10-CM | POA: Diagnosis not present

## 2016-11-07 DIAGNOSIS — Z79899 Other long term (current) drug therapy: Secondary | ICD-10-CM | POA: Diagnosis not present

## 2016-11-07 DIAGNOSIS — Z515 Encounter for palliative care: Secondary | ICD-10-CM | POA: Diagnosis present

## 2016-11-07 DIAGNOSIS — Z66 Do not resuscitate: Secondary | ICD-10-CM | POA: Diagnosis present

## 2016-11-07 DIAGNOSIS — I959 Hypotension, unspecified: Secondary | ICD-10-CM

## 2016-11-07 DIAGNOSIS — I517 Cardiomegaly: Secondary | ICD-10-CM | POA: Diagnosis not present

## 2016-11-07 DIAGNOSIS — R001 Bradycardia, unspecified: Secondary | ICD-10-CM

## 2016-11-07 DIAGNOSIS — N179 Acute kidney failure, unspecified: Secondary | ICD-10-CM | POA: Diagnosis present

## 2016-11-07 DIAGNOSIS — R600 Localized edema: Secondary | ICD-10-CM

## 2016-11-07 DIAGNOSIS — I9589 Other hypotension: Secondary | ICD-10-CM | POA: Diagnosis not present

## 2016-11-07 DIAGNOSIS — I071 Rheumatic tricuspid insufficiency: Secondary | ICD-10-CM

## 2016-11-07 DIAGNOSIS — E739 Lactose intolerance, unspecified: Secondary | ICD-10-CM | POA: Diagnosis present

## 2016-11-07 DIAGNOSIS — R0600 Dyspnea, unspecified: Secondary | ICD-10-CM | POA: Diagnosis not present

## 2016-11-07 DIAGNOSIS — R0602 Shortness of breath: Secondary | ICD-10-CM | POA: Diagnosis not present

## 2016-11-07 DIAGNOSIS — I5030 Unspecified diastolic (congestive) heart failure: Secondary | ICD-10-CM | POA: Diagnosis not present

## 2016-11-07 DIAGNOSIS — I110 Hypertensive heart disease with heart failure: Principal | ICD-10-CM | POA: Diagnosis present

## 2016-11-07 DIAGNOSIS — E118 Type 2 diabetes mellitus with unspecified complications: Secondary | ICD-10-CM

## 2016-11-07 DIAGNOSIS — Z823 Family history of stroke: Secondary | ICD-10-CM

## 2016-11-07 DIAGNOSIS — Z8249 Family history of ischemic heart disease and other diseases of the circulatory system: Secondary | ICD-10-CM

## 2016-11-07 DIAGNOSIS — I10 Essential (primary) hypertension: Secondary | ICD-10-CM

## 2016-11-07 DIAGNOSIS — I272 Pulmonary hypertension, unspecified: Secondary | ICD-10-CM | POA: Diagnosis present

## 2016-11-07 DIAGNOSIS — E119 Type 2 diabetes mellitus without complications: Secondary | ICD-10-CM

## 2016-11-07 DIAGNOSIS — I5031 Acute diastolic (congestive) heart failure: Secondary | ICD-10-CM | POA: Diagnosis not present

## 2016-11-07 LAB — MRSA PCR SCREENING: MRSA BY PCR: NEGATIVE

## 2016-11-07 LAB — BLOOD GAS, ARTERIAL
Acid-Base Excess: 5.6 mmol/L — ABNORMAL HIGH (ref 0.0–2.0)
Bicarbonate: 28.9 mmol/L — ABNORMAL HIGH (ref 20.0–28.0)
DRAWN BY: 105551
FIO2: 50
O2 CONTENT: 8 L/min
O2 Saturation: 91.5 %
PH ART: 7.43 (ref 7.350–7.450)
pCO2 arterial: 45.8 mmHg (ref 32.0–48.0)
pO2, Arterial: 64.9 mmHg — ABNORMAL LOW (ref 83.0–108.0)

## 2016-11-07 LAB — CBC WITH DIFFERENTIAL/PLATELET
BASOS ABS: 0 10*3/uL (ref 0.0–0.1)
BASOS PCT: 0 %
EOS ABS: 0 10*3/uL (ref 0.0–0.7)
EOS PCT: 0 %
HCT: 45 % (ref 36.0–46.0)
Hemoglobin: 14.5 g/dL (ref 12.0–15.0)
LYMPHS PCT: 9 %
Lymphs Abs: 0.7 10*3/uL (ref 0.7–4.0)
MCH: 28.4 pg (ref 26.0–34.0)
MCHC: 32.2 g/dL (ref 30.0–36.0)
MCV: 88.2 fL (ref 78.0–100.0)
Monocytes Absolute: 0.6 10*3/uL (ref 0.1–1.0)
Monocytes Relative: 7 %
Neutro Abs: 7.2 10*3/uL (ref 1.7–7.7)
Neutrophils Relative %: 84 %
PLATELETS: 143 10*3/uL — AB (ref 150–400)
RBC: 5.1 MIL/uL (ref 3.87–5.11)
RDW: 17.9 % — ABNORMAL HIGH (ref 11.5–15.5)
WBC: 8.6 10*3/uL (ref 4.0–10.5)

## 2016-11-07 LAB — COMPREHENSIVE METABOLIC PANEL
ALBUMIN: 2.9 g/dL — AB (ref 3.5–5.0)
ALT: 24 U/L (ref 14–54)
AST: 33 U/L (ref 15–41)
Alkaline Phosphatase: 149 U/L — ABNORMAL HIGH (ref 38–126)
Anion gap: 11 (ref 5–15)
BUN: 37 mg/dL — ABNORMAL HIGH (ref 6–20)
CHLORIDE: 91 mmol/L — AB (ref 101–111)
CO2: 30 mmol/L (ref 22–32)
CREATININE: 1.51 mg/dL — AB (ref 0.44–1.00)
Calcium: 8.4 mg/dL — ABNORMAL LOW (ref 8.9–10.3)
GFR calc non Af Amer: 31 mL/min — ABNORMAL LOW (ref >60)
GFR, EST AFRICAN AMERICAN: 36 mL/min — AB (ref >60)
Glucose, Bld: 118 mg/dL — ABNORMAL HIGH (ref 65–99)
Potassium: 3.6 mmol/L (ref 3.5–5.1)
SODIUM: 132 mmol/L — AB (ref 135–145)
Total Bilirubin: 2.1 mg/dL — ABNORMAL HIGH (ref 0.3–1.2)
Total Protein: 6.4 g/dL — ABNORMAL LOW (ref 6.5–8.1)

## 2016-11-07 LAB — URINALYSIS, ROUTINE W REFLEX MICROSCOPIC
Bilirubin Urine: NEGATIVE
GLUCOSE, UA: NEGATIVE mg/dL
Hgb urine dipstick: NEGATIVE
Ketones, ur: NEGATIVE mg/dL
LEUKOCYTES UA: NEGATIVE
Nitrite: NEGATIVE
PROTEIN: NEGATIVE mg/dL
Specific Gravity, Urine: 1.009 (ref 1.005–1.030)
pH: 5 (ref 5.0–8.0)

## 2016-11-07 LAB — TROPONIN I: Troponin I: <0.03 ng/mL (ref <0.03)

## 2016-11-07 LAB — GLUCOSE, CAPILLARY: Glucose-Capillary: 97 mg/dL (ref 65–99)

## 2016-11-07 LAB — TSH: TSH: 7.021 u[IU]/mL — ABNORMAL HIGH (ref 0.350–4.500)

## 2016-11-07 LAB — BRAIN NATRIURETIC PEPTIDE: B NATRIURETIC PEPTIDE 5: 898 pg/mL — AB (ref 0.0–100.0)

## 2016-11-07 MED ORDER — ACETYLCYSTEINE 600 MG PO CAPS: 1.0000 | ORAL_CAPSULE | Freq: Three times a day (TID) | ORAL | Status: DC

## 2016-11-07 MED ORDER — MAGNESIUM 250 MG PO TABS: 250.0000 mg | ORAL_TABLET | Freq: Every day | ORAL | Status: DC

## 2016-11-07 MED ORDER — AMLODIPINE BESYLATE 5 MG PO TABS
5.0000 mg | ORAL_TABLET | Freq: Every day | ORAL | Status: DC
  Administered 2016-11-07: 5 mg via ORAL
  Filled 2016-11-07: qty 1

## 2016-11-07 MED ORDER — LEVOTHYROXINE SODIUM 25 MCG PO TABS
25.0000 ug | ORAL_TABLET | Freq: Every day | ORAL | Status: DC
  Filled 2016-11-07 (×2): qty 1

## 2016-11-07 MED ORDER — MAGNESIUM OXIDE 400 (241.3 MG) MG PO TABS
200.0000 mg | ORAL_TABLET | Freq: Every day | ORAL | Status: DC
  Administered 2016-11-07 – 2016-11-08 (×2): 200 mg via ORAL
  Filled 2016-11-07 (×2): qty 1

## 2016-11-07 MED ORDER — ACETAMINOPHEN 325 MG PO TABS
650.0000 mg | ORAL_TABLET | ORAL | Status: DC | PRN
  Administered 2016-11-07 – 2016-11-08 (×2): 650 mg via ORAL
  Filled 2016-11-07 (×3): qty 2

## 2016-11-07 MED ORDER — ASPIRIN EC 81 MG PO TBEC
81.0000 mg | DELAYED_RELEASE_TABLET | Freq: Every day | ORAL | Status: DC
  Filled 2016-11-07 (×2): qty 1

## 2016-11-07 MED ORDER — SODIUM CHLORIDE 0.9% FLUSH
3.0000 mL | INTRAVENOUS | Status: DC | PRN
Start: 2016-11-07 — End: 2016-11-09

## 2016-11-07 MED ORDER — SODIUM CHLORIDE 0.9% FLUSH
3.0000 mL | Freq: Two times a day (BID) | INTRAVENOUS | Status: DC
  Administered 2016-11-08: 3 mL via INTRAVENOUS

## 2016-11-07 MED ORDER — SODIUM CHLORIDE 0.9 % IV SOLN: 250.0000 mL | INTRAVENOUS | Status: DC | PRN

## 2016-11-07 MED ORDER — ENOXAPARIN SODIUM 40 MG/0.4ML ~~LOC~~ SOLN
40.0000 mg | SUBCUTANEOUS | Status: DC
  Administered 2016-11-07: 40 mg via SUBCUTANEOUS
  Filled 2016-11-07: qty 0.4

## 2016-11-07 MED ORDER — VITAMIN C 500 MG PO TABS
1000.0000 mg | ORAL_TABLET | Freq: Every day | ORAL | Status: DC
  Administered 2016-11-08: 1000 mg via ORAL
  Filled 2016-11-07: qty 2

## 2016-11-07 MED ORDER — FUROSEMIDE 10 MG/ML IJ SOLN
80.0000 mg | Freq: Once | INTRAMUSCULAR | Status: AC
  Administered 2016-11-07: 80 mg via INTRAVENOUS
  Filled 2016-11-07: qty 8

## 2016-11-07 MED ORDER — ONDANSETRON HCL 4 MG/2ML IJ SOLN
4.0000 mg | Freq: Four times a day (QID) | INTRAMUSCULAR | Status: DC | PRN
  Administered 2016-11-07 – 2016-11-08 (×2): 4 mg via INTRAVENOUS
  Filled 2016-11-07 (×2): qty 2

## 2016-11-07 MED ORDER — LOSARTAN POTASSIUM 50 MG PO TABS
100.0000 mg | ORAL_TABLET | Freq: Every day | ORAL | Status: DC
  Administered 2016-11-07: 100 mg via ORAL
  Filled 2016-11-07: qty 2

## 2016-11-07 MED ORDER — FUROSEMIDE 10 MG/ML IJ SOLN
40.0000 mg | Freq: Two times a day (BID) | INTRAMUSCULAR | Status: DC
  Administered 2016-11-07: 40 mg via INTRAVENOUS
  Filled 2016-11-07 (×2): qty 4

## 2016-11-07 MED ORDER — INSULIN ASPART 100 UNIT/ML ~~LOC~~ SOLN
0.0000 [IU] | Freq: Three times a day (TID) | SUBCUTANEOUS | Status: DC
  Administered 2016-11-08: 1 [IU] via SUBCUTANEOUS

## 2016-11-07 NOTE — Progress Notes (Signed)
Clinical Summary [name] is a 83 y.o.female seen today for follow up of the following medical problems.   1. Afib  - Has not required any AV nodal agents.  - she has been off xarelto due to severe recurrent nose bleeds.  - no palpitations since last visit.   2. Samantha Ryan -  completed holter monitor that showed afib some bradycardia as low as 25 bpm and occasional pauses up to 3 seconds.  - we had worked to get a referral to EP however were unable to contact patient  - Due to her severe lung disease over the last few months has essentially been wheel chair dependent.    - no recent lightheadedness or dizziness.  3. HTN  - she remains compliant with meds  4. Pulmonary fibrosis - [facility] - symptoms have severely progressed - last notes from [facility] mention at least a brief discussion about hospice as her symptoms are progressing.  - 03/2016 6 minute walk O2 down to 88% on 15 liters high flow [place]. Room air O2 sat 80%.   5. Nose bleeds - we referred to ENT. ER visit 10/01/16 with recurrent nosebleed - recent cauterization by ENT - occasional oozing but no heavy bleeds   6. Severe LE edema - significant increase in leg swelling, weight up 16 lbs. Increased SOB, productive cough - compliant with lasix 40mg  daily.  - home O2 up to 8-10 liters    Past Medical History:  Diagnosis Date  . Atrial fibrillation (HCC)   . Diabetes mellitus   . History of colon polyps   . Hyperlipidemia   . Hypertension   . IBS (irritable bowel syndrome)   . Lactose intolerance   . OA (osteoarthritis)   . Pulmonary fibrosis (HCC)   . Rheumatic fever   . Vitamin D insufficiency      Allergies  Allergen Reactions  . Crestor [Rosuvastatin Calcium] Rash  . Lactose Intolerance (Gi)     Can eat cheeses and small portions of milk.  Samantha Ryan. Levaquin [Levofloxacin In D5w] Rash     Current Outpatient Prescriptions  Medication Sig Dispense Refill  . Acetylcysteine (NAC 600) 600 MG  CAPS Take 1 capsule by mouth 3 (three) times daily.     Marland Kitchen. amLODipine (NORVASC) 5 MG tablet Take 1 tablet (5 mg total) by mouth daily. 30 tablet 6  . Ascorbic Acid (VITAMIN C) 1000 MG tablet Take 1 tablet by mouth daily.    . chlorpheniramine (CHLOR-TRIMETON) 4 MG tablet Take 4 mg by mouth 2 (two) times daily as needed. For allergies    . Cholecalciferol (VITAMIN D-3 PO) Take 1,000 Units by mouth daily.     . Coenzyme Q10 (COQ10 PO) Take 1 tablet by mouth daily.     . Fenugreek 500 MG CAPS Take 1 capsule by mouth daily as needed.    . fish oil-omega-3 fatty acids 1000 MG capsule Take 2 g by mouth 2 (two) times a week.     . fluticasone (FLONASE) 50 MCG/ACT nasal spray Place 2 sprays into the nose daily as needed. For allergies    . furosemide (LASIX) 40 MG tablet TAKE 1 TABLET DAILY AS NEEDED 90 tablet 1  . Ginger Root POWD Take 1 tablet by mouth daily.    Marland Kitchen. guaiFENesin (MUCINEX) 600 MG 12 hr tablet Take 1,200 mg by mouth daily as needed. For allergies    . levothyroxine (SYNTHROID, LEVOTHROID) 25 MCG tablet Take 25 mcg by mouth daily.     .Marland Kitchen  loratadine (CLARITIN) 10 MG tablet Take 10 mg by mouth as needed for allergies.    Marland Kitchen losartan (COZAAR) 100 MG tablet Take 100 mg by mouth daily.    . Magnesium 250 MG TABS Take 250 mg by mouth daily.    . metFORMIN (GLUCOPHAGE) 500 MG tablet Take 500 mg by mouth daily with breakfast.     . Pectin Cit-Inos-C-Bioflav-Soy (MODIFIED CITRUS PECTIN PO) Take 2 capsules by mouth 3 (three) times daily.    . psyllium (METAMUCIL) 58.6 % powder Take 1 packet by mouth daily as needed.     . Red Yeast Rice 600 MG TABS Take 600 mg by mouth daily.    . Turmeric 500 MG CAPS Take 1 tablet by mouth daily.     Marland Kitchen zolpidem (AMBIEN) 5 MG tablet Take 5-10 mg by mouth at bedtime as needed for sleep.     No current facility-administered medications for this visit.      Past Surgical History:  Procedure Laterality Date  . APPENDECTOMY    . CATARACT EXTRACTION    . COLONOSCOPY   05/13/2012   Procedure: COLONOSCOPY;  Surgeon: West Bali, MD;  Location: [facility];  Service: Endoscopy;  Laterality: N/A;  9:30  . OVARIAN CYST SURGERY    . TONSILLECTOMY       Allergies  Allergen Reactions  . Crestor [Rosuvastatin Calcium] Rash  . Lactose Intolerance (Gi)     Can eat cheeses and small portions of milk.  Samantha Ryan [Levofloxacin In D5w] Rash      Family History  Problem Relation Age of Onset  . Breast cancer Mother 50    Cause of death  . Stroke Father 1    Cause of Death  . Hypertension Father      Social History Samantha Ryan reports that she quit smoking about 46 years ago. Her smoking use included Cigarettes. She started smoking about 53 years ago. She has a 3.50 pack-year smoking history. She has never used smokeless tobacco. Samantha Ryan reports that she does not drink alcohol.   Review of Systems CONSTITUTIONAL: No weight loss, fever, chills, weakness or fatigue.  HEENT: Eyes: No visual loss, blurred vision, double vision or yellow sclerae.No hearing loss, sneezing, congestion, runny nose or sore throat.  SKIN: No rash or itching.  CARDIOVASCULAR: per HPI RESPIRATORY: per HPI GASTROINTESTINAL: No anorexia, nausea, vomiting or diarrhea. No abdominal pain or blood.  GENITOURINARY: No burning on urination, no polyuria NEUROLOGICAL: No headache, dizziness, syncope, paralysis, ataxia, numbness or tingling in the extremities. No change in bowel or bladder control.  MUSCULOSKELETAL: No muscle, back pain, joint pain or stiffness.  LYMPHATICS: No enlarged nodes. No history of splenectomy.  PSYCHIATRIC: No history of depression or anxiety.  ENDOCRINOLOGIC: No reports of sweating, cold or heat intolerance. No polyuria or polydipsia.  Marland Kitchen   Physical Examination Vitals:   2016-12-02 1613  BP: 118/80  Pulse: 99   Vitals:   2016-12-02 1613  Weight: 181 lb (82.1 kg)  Height: 5\' 3"  (1.6 m)    Gen: resting comfortably, no acute distress HEENT: no scleral  icterus, pupils equal round and reactive, no palptable cervical adenopathy,  CV: RRR, JVD elevated to ear lbow.  Resp: crackles bilaterally GI: abdomen is soft, non-tender, non-distended, normal bowel sounds, no hepatosplenomegaly MSK: extremities are warm, no edema.  Skin: warm, no rash Neuro:  no focal deficits Psych: appropriate affect   Diagnostic Studies 03/2012 Echo Study Conclusions  - Left ventricle: The cavity size  was normal. Wall thickness was increased in a pattern of mild LVH. Systolic function was low normal. The estimated ejection fraction was in the range of 50% to 55%. Although no diagnostic regional wall motion abnormality was identified, this possibility cannot be completely excluded on the basis of this study. The study is not technically sufficient to allow evaluation of LV diastolic function. - Aortic valve: Trileaflet; mildly thickened leaflets. Mean gradient: 6mm Hg (S). - Mitral valve: Mildly thickened leaflets . Mild regurgitation. - Left atrium: The atrium was mildly dilated. - Tricuspid valve: Mild-moderate regurgitation. Peak RV-RA gradient: 20mm Hg (S). - Pericardium, extracardiac: There was no pericardial effusion.  10/2014 echo Study Conclusions  - Left ventricle: The cavity size was normal. Wall thickness was increased in a pattern of mild LVH. The estimated ejection fraction was 55%. Wall motion was normal; there were no regional wall motion abnormalities. Findings consistent with left ventricular diastolic dysfunction. Doppler parameters are consistent with high ventricular filling pressure. - Aortic valve: Sclerosis without stenosis. There was no significant regurgitation. - Mitral valve: There is mitral leaflet thickening. There is mild MR, but no MS. The valve does not have the classic appearance of a hockey puck deformity. - Left atrium: The atrium was mildly dilated. - Right ventricle: The cavity size was normal.  Systolic function was mildly reduced. - Right atrium: The atrium was mildly dilated.   10/2014 echo Study Conclusions  - Left ventricle: The cavity size was normal. Wall thickness was increased in a pattern of mild LVH. The estimated ejection fraction was 55%. Wall motion was normal; there were no regional wall motion abnormalities. Findings consistent with left ventricular diastolic dysfunction. Doppler parameters are consistent with high ventricular filling pressure. - Aortic valve: Sclerosis without stenosis. There was no significant regurgitation. - Mitral valve: There is mitral leaflet thickening. There is mild MR, but no MS. The valve does not have the classic appearance of a hockey puck deformity. - Left atrium: The atrium was mildly dilated. - Right ventricle: The cavity size was normal. Systolic function was mildly reduced. - Right atrium: The atrium was mildly dilated.  Assessment and Plan  1. Afib - denies any current symptoms, has not required AV nodal agents. - off anticoag due to severe recurrent nose bleeds - no change in thearpy  2. Bradycardia - previous monitor with episodes of severe bradycardia - due to her severe lung disease she is not a candidate for pacemaker consideration.  - continue to monitor   3. HTN - she is at goal, she will continue current meds  4. Pulmonary fibrosis - followed by [facility], appears clinical course has severely deteriorated over the last several months.   5. Acute on chronic diastolic HF - severe volume overload, 16 lbs weight gain.  - will need admission for IV diuresis. I have contacted ER staff, we will send her over from office - would start with IV lasix 60mg  IV bid, place foley catheter. - repeat echo, I suspect due to her worsening pulmonary fibrosis she likely has a component of RV failure as well.        Antoine Poche, M.D.,

## 2016-11-07 NOTE — H&P (Signed)
History and Physical    [place] ZOX:[id number] DOB: July 01, 1931 DOA: 10/31/2016  PCP: Juliette Alcide, MD  Patient coming from:   home  Chief Complaint:   Swelling all over  HPI: Samantha Ryan is a 83 y.o. female with medical history significant of severe CRF due to pulmonary fibrosis on 6-7 liters oxygen follows at [facility], DM, afib comes in for over a week of swelling to her legs up to her thighs, pannus.  With worsening sob, orthopnea and PND.  Pt has no known h/o CHF.  Reports over 15 lbs weight gain in about a week.  Pt went to see dr branch today, who referred her to the ED.  She has been placed on lasix 40mg  po daily for about a week, previously was on only 20mg  prn.  This has not helped her much and has continued to worsen.  She denies any fevers.  Gets very sob with any activity.  Pt referred for admission for acute CHF exacerbation and worsening hypoxia.   Review of Systems: As per HPI otherwise 10 point review of systems negative.   Past Medical History:  Diagnosis Date  . Atrial fibrillation (HCC)   . Diabetes mellitus   . History of colon polyps   . Hyperlipidemia   . Hypertension   . IBS (irritable bowel syndrome)   . Lactose intolerance   . OA (osteoarthritis)   . Pulmonary fibrosis (HCC)   . Rheumatic fever   . Vitamin D insufficiency     Past Surgical History:  Procedure Laterality Date  . APPENDECTOMY    . CATARACT EXTRACTION    . COLONOSCOPY  05/13/2012   Procedure: COLONOSCOPY;  Surgeon: West Bali, MD;  Location: [facility];  Service: Endoscopy;  Laterality: N/A;  9:30  . OVARIAN CYST SURGERY    . TONSILLECTOMY       reports that she quit smoking about 46 years ago. Her smoking use included Cigarettes. She started smoking about 53 years ago. She has a 3.50 pack-year smoking history. She has never used smokeless tobacco. She reports that she does not drink alcohol or use drugs.  Allergies  Allergen Reactions  . Crestor [Rosuvastatin Calcium] Rash  .  Lactose Intolerance (Gi)     Can eat cheeses and small portions of milk.  Barbera Setters [Levofloxacin In D5w] Rash    Family History  Problem Relation Age of Onset  . Breast cancer Mother 106    Cause of death  . Stroke Father 60    Cause of Death  . Hypertension Father     Prior to Admission medications   Medication Sig Start Date End Date Taking? Authorizing Provider  Acetylcysteine (NAC 600) 600 MG CAPS Take 1 capsule by mouth 3 (three) times daily.     Historical Provider, MD  amLODipine (NORVASC) 5 MG tablet Take 1 tablet (5 mg total) by mouth daily. 03/31/12   Rollene Rotunda, MD  Ascorbic Acid (VITAMIN C) 1000 MG tablet Take 1 tablet by mouth daily.    Historical Provider, MD  chlorpheniramine (CHLOR-TRIMETON) 4 MG tablet Take 4 mg by mouth 2 (two) times daily as needed. For allergies    Historical Provider, MD  Cholecalciferol (VITAMIN D-3 PO) Take 1,000 Units by mouth daily.     Historical Provider, MD  Coenzyme Q10 (COQ10 PO) Take 1 tablet by mouth daily.     Historical Provider, MD  Fenugreek 500 MG CAPS Take 1 capsule by mouth daily as needed.  Historical Provider, MD  fish oil-omega-3 fatty acids 1000 MG capsule Take 2 g by mouth 2 (two) times a week.     Historical Provider, MD  fluticasone (FLONASE) 50 MCG/ACT nasal spray Place 2 sprays into the nose daily as needed. For allergies    Historical Provider, MD  furosemide (LASIX) 40 MG tablet TAKE 1 TABLET DAILY AS NEEDED 10/29/16   Antoine PocheJonathan F Branch, MD  Ginger Root POWD Take 1 tablet by mouth daily.    Historical Provider, MD  guaiFENesin (MUCINEX) 600 MG 12 hr tablet Take 1,200 mg by mouth daily as needed. For allergies    Historical Provider, MD  levothyroxine (SYNTHROID, LEVOTHROID) 25 MCG tablet Take 25 mcg by mouth daily.  03/24/12   Historical Provider, MD  loratadine (CLARITIN) 10 MG tablet Take 10 mg by mouth as needed for allergies.    Historical Provider, MD  losartan (COZAAR) 100 MG tablet Take 100 mg by mouth daily.     Historical Provider, MD  Magnesium 250 MG TABS Take 250 mg by mouth daily.    Historical Provider, MD  metFORMIN (GLUCOPHAGE) 500 MG tablet Take 500 mg by mouth daily with breakfast.     Historical Provider, MD  Pectin Cit-Inos-C-Bioflav-Soy (MODIFIED CITRUS PECTIN PO) Take 2 capsules by mouth 3 (three) times daily.    Historical Provider, MD  psyllium (METAMUCIL) 58.6 % powder Take 1 packet by mouth daily as needed.     Historical Provider, MD  Red Yeast Rice 600 MG TABS Take 600 mg by mouth daily.    Historical Provider, MD  Turmeric 500 MG CAPS Take 1 tablet by mouth daily.     Historical Provider, MD  zolpidem (AMBIEN) 5 MG tablet Take 5-10 mg by mouth at bedtime as needed for sleep.    Historical Provider, MD    Physical Exam: Vitals:   11/02/2016 1830 11/08/2016 1900 10/25/2016 1915 10/27/2016 1937  BP: 124/99 138/92  100/76  Pulse: 95   97  Resp: 20 (!) 31 22 22   SpO2: (!) 84%   (!) 84%  Weight:      Height:        Constitutional: NAD, calm, comfortable Vitals:   11/22/2016 1830 10/25/2016 1900 11/16/2016 1915 11/23/2016 1937  BP: 124/99 138/92  100/76  Pulse: 95   97  Resp: 20 (!) 31 22 22   SpO2: (!) 84%   (!) 84%  Weight:      Height:       Eyes: PERRL, lids and conjunctivae normal ENMT: Mucous membranes are moist. Posterior pharynx clear of any exudate or lesions.Normal dentition.  Neck: normal, supple, no masses, no thyromegaly Respiratory: clear to auscultation bilaterally, no wheezing, no crackles. Normal respiratory effort. No accessory muscle use.  Cardiovascular: Regular rate and rhythm, no murmurs / rubs / gallops. 4+ extremity edema up to thighs with pannus dependent edema. 2+ pedal pulses. No carotid bruits.  Abdomen: no tenderness, no masses palpated. No hepatosplenomegaly. Bowel sounds positive.  Musculoskeletal: no clubbing / cyanosis. No joint deformity upper and lower extremities. Good ROM, no contractures. Normal muscle tone.  Skin: no rashes, lesions, ulcers. No  induration Neurologic: CN 2-12 grossly intact. Sensation intact, DTR normal. Strength 5/5 in all 4.  Psychiatric: Normal judgment and insight. Alert and oriented x 3. Normal mood.    Labs on Admission: I have personally reviewed following labs and imaging studies  CBC:  Recent Labs Lab 11/18/2016 1738  WBC 8.6  NEUTROABS 7.2  HGB 14.5  HCT  45.0  MCV 88.2  PLT 143*   Basic Metabolic Panel:  Recent Labs Lab 11/16/2016 1738  NA 132*  K 3.6  CL 91*  CO2 30  GLUCOSE 118*  BUN 37*  CREATININE 1.51*  CALCIUM 8.4*   GFR: Estimated Creatinine Clearance: 28.7 mL/min (by C-G formula based on SCr of 1.51 mg/dL (H)). Liver Function Tests:  Recent Labs Lab 11/12/2016 1738  AST 33  ALT 24  ALKPHOS 149*  BILITOT 2.1*  PROT 6.4*  ALBUMIN 2.9*   Cardiac Enzymes:  Recent Labs Lab 11/02/2016 1738  TROPONINI <0.03   Urine analysis:    Component Value Date/Time   COLORURINE YELLOW 11/03/2016 1756   APPEARANCEUR CLEAR 11/08/2016 1756   LABSPEC 1.009 11/19/2016 1756   PHURINE 5.0 11/21/2016 1756   GLUCOSEU NEGATIVE 11/10/2016 1756   HGBUR NEGATIVE 11/14/2016 1756   BILIRUBINUR NEGATIVE 10/26/2016 1756   KETONESUR NEGATIVE 11/20/2016 1756   PROTEINUR NEGATIVE 11/06/2016 1756   NITRITE NEGATIVE 11/23/2016 1756   LEUKOCYTESUR NEGATIVE 10/26/2016 1756    Radiological Exams on Admission: Dg Chest 2 View  Result Date: 11/19/2016 CLINICAL DATA:  83 y/o  F; cough and shortness of breath. EXAM: CHEST  2 VIEW COMPARISON:  None. FINDINGS: Coarse interstitial lung markings consistent with pulmonary fibrosis. No focal consolidation. No pleural effusion or pneumothorax. Low lung volumes. Normal cardiac silhouette given projection and technique. Mild degenerative changes of the thoracic spine. Bones are otherwise unremarkable. IMPRESSION: Pulmonary fibrosis. No focal consolidation, effusion, or pneumothorax. Electronically Signed   By: Mitzi Hansen M.D.   On: 11/15/2016 19:33     EKG: Independently reviewed.  Old chart reviewed Case discussed with dr Erin Hearing cxr reviewed no infiltrate no edema  Assessment/Plan 83 yo female with severe pulmonary fibrosis, CRF on 6 liters [place] at home comes in with new onset CHF likely diastolic   Principal Problem:   Acute on chronic respiratory failure with hypoxia (HCC)- this is all worsened likely to diastolic chf.  Treat chf.  Pt on 6 liters right now, and mentating well, desats easily with any movement  Active Problems:   Acute CHF (congestive heart failure) (HCC)- given lasix 80mg  iv in ED, will cont at lasix 40mg  iv q 12 hours, consider increasing if renal function allows.  Pt just had echo about 2 weeks ago will not repeat.  Consult cardiology dr branch.     Pulmonary fibrosis (HCC)- noted, treat chf, consider adding steroids if does not improve with aggressive diuresis, but this does not appear to be her main issue at this time   Diabetes mellitus (HCC)- SSI   Atrial fibrillation (HCC)- rate controlled   Confirmed with patient she is DNR, no cpr or intubation under any circumstances in the future.  DVT prophylaxis:  Scds, lovenox Code Status: DNR Family Communication:  none Disposition Plan: per day team Consults called:  Cardiology requested in epic Admission status:   admission   Shinita Mac A MD [facility]  If 7PM-7AM, please contact night-coverage [email or web address] Password [facility]  11/06/2016, 8:06 PM

## 2016-11-07 NOTE — ED Provider Notes (Signed)
[facility]-EMERG[facility]Y DEPT Provider Note   CSN: [id number] Arrival date & time: 11/11/2016  1717     History   Chief Complaint Chief Complaint  Patient presents with  . Shortness of Breath    HPI Samantha Ryan is a 83 y.o. female.   Shortness of Breath  This is a chronic problem. The problem occurs continuously.The current episode started more than 1 week ago. The problem has been gradually worsening. Associated symptoms include cough, PND, orthopnea and leg swelling. Pertinent negatives include no fever and no sore throat. She has tried nothing for the symptoms. The treatment provided no relief. Associated medical issues include chronic lung disease.    Past Medical History:  Diagnosis Date  . Atrial fibrillation (HCC)   . Diabetes mellitus   . History of colon polyps   . Hyperlipidemia   . Hypertension   . IBS (irritable bowel syndrome)   . Lactose intolerance   . OA (osteoarthritis)   . Pulmonary fibrosis (HCC)   . Rheumatic fever   . Vitamin D insufficiency     Patient Active Problem List   Diagnosis Date Noted  . Atrial fibrillation (HCC) 03/31/2012  . History of colon polyps   . Diarrhea 03/25/2012  . DM 02/13/2010  . OTHER SPECIFIED DISORDERS OF ADRENAL GLANDS 02/13/2010  . HTN (hypertension) 02/13/2010  . AV FISTULA 02/13/2010  . PALPITATIONS 02/13/2010  . DYSPNEA 02/13/2010  . PRECORDIAL PAIN 02/13/2010    Past Surgical History:  Procedure Laterality Date  . APPENDECTOMY    . CATARACT EXTRACTION    . COLONOSCOPY  05/13/2012   Procedure: COLONOSCOPY;  Surgeon: West Bali, MD;  Location: [facility];  Service: Endoscopy;  Laterality: N/A;  9:30  . OVARIAN CYST SURGERY    . TONSILLECTOMY      OB History    No data available       Home Medications    Prior to Admission medications   Medication Sig Start Date End Date Taking? Authorizing Provider  Acetylcysteine (NAC 600) 600 MG CAPS Take 1 capsule by mouth 3 (three) times daily.     Historical  Provider, MD  amLODipine (NORVASC) 5 MG tablet Take 1 tablet (5 mg total) by mouth daily. 03/31/12   Rollene Rotunda, MD  Ascorbic Acid (VITAMIN C) 1000 MG tablet Take 1 tablet by mouth daily.    Historical Provider, MD  chlorpheniramine (CHLOR-TRIMETON) 4 MG tablet Take 4 mg by mouth 2 (two) times daily as needed. For allergies    Historical Provider, MD  Cholecalciferol (VITAMIN D-3 PO) Take 1,000 Units by mouth daily.     Historical Provider, MD  Coenzyme Q10 (COQ10 PO) Take 1 tablet by mouth daily.     Historical Provider, MD  Fenugreek 500 MG CAPS Take 1 capsule by mouth daily as needed.    Historical Provider, MD  fish oil-omega-3 fatty acids 1000 MG capsule Take 2 g by mouth 2 (two) times a week.     Historical Provider, MD  fluticasone (FLONASE) 50 MCG/ACT nasal spray Place 2 sprays into the nose daily as needed. For allergies    Historical Provider, MD  furosemide (LASIX) 40 MG tablet TAKE 1 TABLET DAILY AS NEEDED 10/29/16   Antoine Poche, MD  Ginger Root POWD Take 1 tablet by mouth daily.    Historical Provider, MD  guaiFENesin (MUCINEX) 600 MG 12 hr tablet Take 1,200 mg by mouth daily as needed. For allergies    Historical Provider, MD  levothyroxine (SYNTHROID,  LEVOTHROID) 25 MCG tablet Take 25 mcg by mouth daily.  03/24/12   Historical Provider, MD  loratadine (CLARITIN) 10 MG tablet Take 10 mg by mouth as needed for allergies.    Historical Provider, MD  losartan (COZAAR) 100 MG tablet Take 100 mg by mouth daily.    Historical Provider, MD  Magnesium 250 MG TABS Take 250 mg by mouth daily.    Historical Provider, MD  metFORMIN (GLUCOPHAGE) 500 MG tablet Take 500 mg by mouth daily with breakfast.     Historical Provider, MD  Pectin Cit-Inos-C-Bioflav-Soy (MODIFIED CITRUS PECTIN PO) Take 2 capsules by mouth 3 (three) times daily.    Historical Provider, MD  psyllium (METAMUCIL) 58.6 % powder Take 1 packet by mouth daily as needed.     Historical Provider, MD  Red Yeast Rice 600 MG TABS  Take 600 mg by mouth daily.    Historical Provider, MD  Turmeric 500 MG CAPS Take 1 tablet by mouth daily.     Historical Provider, MD  zolpidem (AMBIEN) 5 MG tablet Take 5-10 mg by mouth at bedtime as needed for sleep.    Historical Provider, MD    Family History Family History  Problem Relation Age of Onset  . Breast cancer Mother 2361    Cause of death  . Stroke Father 4151    Cause of Death  . Hypertension Father     Social History Social History  Substance Use Topics  . Smoking status: Former Smoker    Packs/day: 0.50    Years: 7.00    Types: Cigarettes    Start date: 08/28/1963    Quit date: 08/27/1970  . Smokeless tobacco: Never Used  . Alcohol use No     Allergies   Crestor [rosuvastatin calcium]; Lactose intolerance (gi); and Levaquin [levofloxacin in d5w]   Review of Systems Review of Systems  Constitutional: Positive for fatigue. Negative for fever.  HENT: Negative for sore throat.   Respiratory: Positive for cough and shortness of breath.   Cardiovascular: Positive for orthopnea, leg swelling and PND.  Neurological: Positive for weakness.  All other systems reviewed and are negative.    Physical Exam Updated Vital Signs There were no vitals taken for this visit.  Physical Exam  Constitutional: She appears well-developed and well-nourished.  HENT:  Head: Normocephalic and atraumatic.  Eyes: Conjunctivae and EOM are normal. Pupils are equal, round, and reactive to light.  Neck: Normal range of motion.  Cardiovascular: Normal rate and regular rhythm.   Pulmonary/Chest: No stridor. No respiratory distress. She has wheezes (minimal). She has rales (bases).  Baseline O2 sat in mid 80's  Abdominal: Soft. She exhibits no distension. There is no tenderness.  Musculoskeletal: She exhibits edema (BLE).  Neurological: She is alert. No cranial nerve deficit.  Skin: Skin is warm and dry.  Nursing note and vitals reviewed.    ED Treatments / Results  Labs (all  labs ordered are listed, but only abnormal results are displayed) Labs Reviewed - No data to display  EKG  EKG Interpretation None       Radiology No results found.  Procedures Procedures (including critical care time)  Medications Ordered in ED Medications - No data to display   Initial Impression / Assessment and Plan / ED Course  I have reviewed the triage vital signs and the nursing notes.  Pertinent labs & imaging results that were available during my care of the patient were reviewed by me and considered in my medical decision  making (see chart for details).    Sent from Dr. Wyline Mood for admission for new onset CHF. Lasix given, plan for admission.  Lab and workup consistent with CHF exacerbation on top of chronic pulmonary fibrosis. Oxygen saturation is 83-86 on her baseline 7 liters but this is where she stays per the daughter and she is rarely above 90.  Medicine to admit to stepdown. Patient states she knows her lung condition is terminal and thus wants to be DNR/DNI  Final Clinical Impressions(s) / ED Diagnoses   Final diagnoses:  Pulmonary fibrosis (HCC)      Marily Memos, MD 11/06/2016 2317

## 2016-11-07 NOTE — Telephone Encounter (Signed)
Please give pt's daughter Bonita QuinLinda a call @ [phone] --pt is retaining fluid and would like to speak w/ someone

## 2016-11-07 NOTE — Telephone Encounter (Signed)
Spoke with pt's daughter. She is very concerned with her mother's swelling and SOB. Made appointment for pt to see Dr. Wyline MoodBranch today @ 3:40.

## 2016-11-07 NOTE — ED Triage Notes (Addendum)
Pt brought from [facility]. Pt family reports 16lbs in last three weeks. Pt denies pain at this time but reports increased sob breath. +3 BLE swelling. Generalized swelling also noted. Pt reports normal o2 saturation 85%. Pt currently on face mask 8 liters. Pt reports wears 8 liters at rest and reports 10 liters while ambulating.

## 2016-11-07 NOTE — ED Notes (Signed)
Patient complaining of pain to "tailbone." Placed pillow under patient's bottom and repositioned patient. Patient tolerated well.

## 2016-11-08 ENCOUNTER — Inpatient Hospital Stay (HOSPITAL_COMMUNITY): Payer: Medicare Other

## 2016-11-08 DIAGNOSIS — R001 Bradycardia, unspecified: Secondary | ICD-10-CM

## 2016-11-08 DIAGNOSIS — I071 Rheumatic tricuspid insufficiency: Secondary | ICD-10-CM

## 2016-11-08 DIAGNOSIS — I519 Heart disease, unspecified: Secondary | ICD-10-CM

## 2016-11-08 DIAGNOSIS — I517 Cardiomegaly: Secondary | ICD-10-CM

## 2016-11-08 DIAGNOSIS — I10 Essential (primary) hypertension: Secondary | ICD-10-CM

## 2016-11-08 DIAGNOSIS — I5033 Acute on chronic diastolic (congestive) heart failure: Secondary | ICD-10-CM

## 2016-11-08 DIAGNOSIS — J8410 Pulmonary fibrosis, unspecified: Secondary | ICD-10-CM

## 2016-11-08 DIAGNOSIS — R600 Localized edema: Secondary | ICD-10-CM

## 2016-11-08 DIAGNOSIS — I272 Pulmonary hypertension, unspecified: Secondary | ICD-10-CM

## 2016-11-08 DIAGNOSIS — I9589 Other hypotension: Secondary | ICD-10-CM

## 2016-11-08 DIAGNOSIS — J9621 Acute and chronic respiratory failure with hypoxia: Secondary | ICD-10-CM

## 2016-11-08 DIAGNOSIS — I482 Chronic atrial fibrillation: Secondary | ICD-10-CM

## 2016-11-08 DIAGNOSIS — R0600 Dyspnea, unspecified: Secondary | ICD-10-CM

## 2016-11-08 LAB — ECHOCARDIOGRAM COMPLETE
CHL CUP DOP CALC LVOT VTI: 7.14 cm
CHL CUP STROKE VOLUME: 11 mL
E decel time: 232 msec
EERAT: 8.03
FS: 15 % — AB (ref 28–44)
Height: 63 in
IV/PV OW: 1.09
LA ID, A-P, ES: 34 mm
LA vol A4C: 20 ml
LA vol: 34.8 mL
LADIAMINDEX: 1.76 cm/m2
LAVOLIN: 18 mL/m2
LDCA: 2.54 cm2
LEFT ATRIUM END SYS DIAM: 34 mm
LV E/e' medial: 8.03
LV PW d: 12 mm — AB (ref 0.6–1.1)
LV SIMPSON'S DISK: 51
LV TDI E'LATERAL: 7.83
LV e' LATERAL: 7.83 cm/s
LV sys vol index: 6 mL/m2
LV sys vol: 11 mL — AB (ref 14–42)
LVDIAVOL: 22 mL — AB (ref 46–106)
LVDIAVOLIN: 12 mL/m2
LVEEAVG: 8.03
LVOT diameter: 18 mm
LVOT peak grad rest: 1 mmHg
LVOTPV: 57.1 cm/s
LVOTSV: 18 mL
MV Dec: 232
MVPKEVEL: 62.9 m/s
RV sys press: 50 mmHg
Reg peak vel: 296 cm/s
TAPSE: 10.7 mm
TRMAXVEL: 296 cm/s
WEIGHTICAEL: 2871.27 [oz_av]

## 2016-11-08 LAB — BASIC METABOLIC PANEL
Anion gap: 9 (ref 5–15)
BUN: 36 mg/dL — ABNORMAL HIGH (ref 6–20)
CO2: 33 mmol/L — ABNORMAL HIGH (ref 22–32)
Calcium: 8.4 mg/dL — ABNORMAL LOW (ref 8.9–10.3)
Chloride: 93 mmol/L — ABNORMAL LOW (ref 101–111)
Creatinine, Ser: 1.47 mg/dL — ABNORMAL HIGH (ref 0.44–1.00)
GFR calc Af Amer: 37 mL/min — ABNORMAL LOW (ref >60)
GFR calc non Af Amer: 32 mL/min — ABNORMAL LOW (ref >60)
Glucose, Bld: 101 mg/dL — ABNORMAL HIGH (ref 65–99)
Potassium: 3.9 mmol/L (ref 3.5–5.1)
Sodium: 135 mmol/L (ref 135–145)

## 2016-11-08 LAB — GLUCOSE, CAPILLARY
GLUCOSE-CAPILLARY: 137 mg/dL — AB (ref 65–99)
GLUCOSE-CAPILLARY: 89 mg/dL (ref 65–99)
Glucose-Capillary: 99 mg/dL (ref 65–99)

## 2016-11-08 LAB — TROPONIN I: Troponin I: <0.03 ng/mL (ref <0.03)

## 2016-11-08 MED ORDER — PROMETHAZINE HCL 12.5 MG RE SUPP
12.5000 mg | Freq: Three times a day (TID) | RECTAL | Status: DC | PRN
  Administered 2016-11-08: 12.5 mg via RECTAL
  Filled 2016-11-08: qty 1

## 2016-11-08 MED ORDER — ONDANSETRON HCL 4 MG/2ML IJ SOLN: 4.0000 mg | Freq: Four times a day (QID) | INTRAMUSCULAR | Status: DC

## 2016-11-08 MED ORDER — TRAMADOL HCL 50 MG PO TABS
50.0000 mg | ORAL_TABLET | Freq: Two times a day (BID) | ORAL | Status: DC | PRN
  Administered 2016-11-08: 50 mg via ORAL
  Filled 2016-11-08: qty 1

## 2016-11-08 MED ORDER — ENOXAPARIN SODIUM 30 MG/0.3ML ~~LOC~~ SOLN: 30.0000 mg | SUBCUTANEOUS | Status: DC

## 2016-11-08 MED ORDER — ALBUMIN HUMAN 25 % IV SOLN
12.5000 g | Freq: Once | INTRAVENOUS | Status: AC
  Administered 2016-11-08: 12.5 g via INTRAVENOUS
  Filled 2016-11-08: qty 50

## 2016-11-08 MED ORDER — SODIUM CHLORIDE 0.9 % IV BOLUS (SEPSIS)
250.0000 mL | Freq: Once | INTRAVENOUS | Status: AC
  Administered 2016-11-08: 250 mL via INTRAVENOUS

## 2016-11-08 MED ORDER — ALBUMIN HUMAN 25 % IV SOLN
INTRAVENOUS | Status: AC
  Filled 2016-11-08: qty 50

## 2016-11-08 MED ORDER — FUROSEMIDE 10 MG/ML IJ SOLN: 20.0000 mg | Freq: Two times a day (BID) | INTRAMUSCULAR | Status: DC

## 2016-11-08 NOTE — Progress Notes (Signed)
Patient has wide swings in SpO2 due to History and current diagnosis. Pt states she is in the 70 during sleep at home.

## 2016-11-08 NOTE — Progress Notes (Signed)
Notified Physician ( mid level position) for SBP. Orders received and completed. BP is responding almost to original values at this time, please see flowsheet.

## 2016-11-08 NOTE — Consult Note (Signed)
CARDIOLOGY CONSULT NOTE  Patient ID: Samantha Ryan MRN: [id number] DOB/AGE: 1931-08-12 83 y.o.  Admit date: 12-04-2016 Primary Physician: Juliette Alcide, MD Referring Physician: Irene Limbo MD Primary cardiologist: Wyline Mood MD  Reason for Consultation: CHF  HPI: 83 yr old woman who is essentially wheelchair bound with severe pulmonary fibrosis followed at [facility], nose bleeds, bradycardia with pauses, and atrial fibrillation who was evaluated by Dr. Wyline Mood in the clinic on 3/14 and he recommended hospitalization for IV diuresis due to severe lower extremity edema with 16 lb weight gain and acute on chronic diastolic heart failure. She was markedly hypoxemic (80% on RA) and normally uses 8 liters of O2 at rest, 10 while ambulating. Apparently a palliative care discussion has been considered.  She is DNR/DNI code status.  She was given 80 mg IV Lasix yesterday and is now on 20 mg IV bid.  Chest xray 3/14: pulmonary fibrosis.  She is getting an echocardiogram at the time of my evaluation.  Preliminary view shows significant RV enlargement and dysfunction with severe tricuspid regurgitation and flattening of the interventricular septum in systole and diastole, indicative of RV volume and pressure overload.  She says she feels much better with respect to shortness of breath after having received IV Lasix.     Allergies  Allergen Reactions  . Crestor [Rosuvastatin Calcium] Rash  . Lactose Intolerance (Gi)     Can eat cheeses and small portions of milk.  Barbera Setters [Levofloxacin In D5w] Nausea And Vomiting and Rash    Current Facility-Administered Medications  Medication Dose Route Frequency Provider Last Rate Last Dose  . 0.9 %  sodium chloride infusion  250 mL Intravenous PRN Haydee Monica, MD      . acetaminophen (TYLENOL) tablet 650 mg  650 mg Oral Q4H PRN Haydee Monica, MD   650 mg at 11/08/16 1007  . Acetylcysteine CAPS 600 mg  1 capsule Oral TID Haydee Monica, MD      .  aspirin EC tablet 81 mg  81 mg Oral Daily Rachal Cliffton Asters, MD      . enoxaparin (LOVENOX) injection 30 mg  30 mg Subcutaneous Q24H Standley Brooking, MD      . furosemide (LASIX) injection 20 mg  20 mg Intravenous Q12H Standley Brooking, MD      . insulin aspart (novoLOG) injection 0-9 Units  0-9 Units Subcutaneous TID WC Rachal Cliffton Asters, MD      . levothyroxine (SYNTHROID, LEVOTHROID) tablet 25 mcg  25 mcg Oral QAC breakfast Haydee Monica, MD      . magnesium oxide (MAG-OX) tablet 200 mg  200 mg Oral Daily Haydee Monica, MD   200 mg at 11/08/16 1010  . ondansetron (ZOFRAN) injection 4 mg  4 mg Intravenous Q6H PRN Haydee Monica, MD   4 mg at 04-Dec-2016 2217  . sodium chloride flush (NS) 0.9 % injection 3 mL  3 mL Intravenous Q12H Haydee Monica, MD   3 mL at 11/08/16 1018  . sodium chloride flush (NS) 0.9 % injection 3 mL  3 mL Intravenous PRN Haydee Monica, MD      . traMADol Janean Sark) tablet 50 mg  50 mg Oral Q12H PRN Standley Brooking, MD   50 mg at 11/08/16 1038  . vitamin C (ASCORBIC ACID) tablet 1,000 mg  1,000 mg Oral Daily Haydee Monica, MD   1,000 mg at 11/08/16 1017    Past Medical History:  Diagnosis Date  . Atrial fibrillation (HCC)   . Diabetes mellitus   . History of colon polyps   . Hyperlipidemia   . Hypertension   . IBS (irritable bowel syndrome)   . Lactose intolerance   . OA (osteoarthritis)   . Pulmonary fibrosis (HCC)   . Rheumatic fever   . Vitamin D insufficiency     Past Surgical History:  Procedure Laterality Date  . APPENDECTOMY    . CATARACT EXTRACTION    . COLONOSCOPY  05/13/2012   Procedure: COLONOSCOPY;  Surgeon: West Bali, MD;  Location: [facility];  Service: Endoscopy;  Laterality: N/A;  9:30  . OVARIAN CYST SURGERY    . TONSILLECTOMY      Social History   Social History  . Marital status: Widowed    Spouse name: N/A  . Number of children: 2  . Years of education: N/A   Occupational History  . Not on file.   Social History Main  Topics  . Smoking status: Former Smoker    Packs/day: 0.50    Years: 7.00    Types: Cigarettes    Start date: 08/28/1963    Quit date: 08/27/1970  . Smokeless tobacco: Never Used  . Alcohol use No  . Drug use: No  . Sexual activity: Not on file   Other Topics Concern  . Not on file   Social History Narrative  . No narrative on file     No family history of premature CAD in 1st degree relatives.  Prior to Admission medications   Medication Sig Start Date End Date Taking? Authorizing Provider  amLODipine (NORVASC) 5 MG tablet Take 1 tablet (5 mg total) by mouth daily. 03/31/12  Yes Rollene Rotunda, MD  Ascorbic Acid (VITAMIN C) 1000 MG tablet Take 1 tablet by mouth once a week.    Yes Historical Provider, MD  Cholecalciferol (VITAMIN D-3 PO) Take 1,000 Units by mouth once a week.    Yes Historical Provider, MD  Coenzyme Q10 (COQ10 PO) Take 1 tablet by mouth every other day.    Yes Historical Provider, MD  Fenugreek 500 MG CAPS Take 1 capsule by mouth daily as needed (for congestion).    Yes Historical Provider, MD  fish oil-omega-3 fatty acids 1000 MG capsule Take 2 g by mouth 2 (two) times a week.    Yes Historical Provider, MD  fluticasone (FLONASE) 50 MCG/ACT nasal spray Place 2 sprays into the nose daily as needed. For allergies   Yes Historical Provider, MD  furosemide (LASIX) 40 MG tablet TAKE 1 TABLET DAILY AS NEEDED Patient taking differently: Take 40 mg by mouth daily. TAKE 1 TABLET DAILY AS NEEDED 10/29/16  Yes Antoine Poche, MD  guaiFENesin (MUCINEX) 600 MG 12 hr tablet Take 600 mg by mouth every 12 (twelve) hours. For allergies   Yes Historical Provider, MD  levothyroxine (SYNTHROID, LEVOTHROID) 25 MCG tablet Take 25 mcg by mouth daily.  03/24/12  Yes Historical Provider, MD  losartan (COZAAR) 100 MG tablet Take 100 mg by mouth daily.   Yes Historical Provider, MD  Magnesium 250 MG TABS Take 250 mg by mouth daily.   Yes Historical Provider, MD  Pectin Cit-Inos-C-Bioflav-Soy  (MODIFIED CITRUS PECTIN PO) Take 2 capsules by mouth 3 (three) times daily.   Yes Historical Provider, MD  chlorpheniramine (CHLOR-TRIMETON) 4 MG tablet Take 4 mg by mouth 2 (two) times daily as needed. For allergies    Historical Provider, MD  loratadine (CLARITIN) 10 MG  tablet Take 10 mg by mouth as needed for allergies.    Historical Provider, MD  psyllium (METAMUCIL) 58.6 % powder Take 1 packet by mouth daily as needed.     Historical Provider, MD     Review of systems complete and found to be negative unless listed above in HPI     Physical exam Blood pressure 98/83, pulse 94, temperature 97.7 F (36.5 C), temperature source Axillary, resp. rate 14, height 5\' 3"  (1.6 m), weight 179 lb 7.3 oz (81.4 kg), SpO2 97 %. General: NAD Neck: +JVD to angle of jaw  Lungs: Bilateral crackles. CV: Nondisplaced PMI. Regular rate and irregular rhythm, normal S1/S2, no S3, 3/6 pansystolic murmur along left sternal border. 3+ pitting lower extremity edema b/l above the knees.   Abdomen: Soft, nontender, no distention.  Skin: Intact without lesions or rashes.  Neurologic: Alert and oriented x 3.  Psych: Normal affect. HEENT: Normal.   ECG: Most recent ECG reviewed.  Telemetry: Independently reviewed.  Labs:   Lab Results  Component Value Date   WBC 8.6 11/11/2016   HGB 14.5 11/08/2016   HCT 45.0 11/06/2016   MCV 88.2 11/01/2016   PLT 143 (L) 10/25/2016    Recent Labs Lab 10/30/2016 1738 11/08/16 0322  NA 132* 135  K 3.6 3.9  CL 91* 93*  CO2 30 33*  BUN 37* 36*  CREATININE 1.51* 1.47*  CALCIUM 8.4* 8.4*  PROT 6.4*  --   BILITOT 2.1*  --   ALKPHOS 149*  --   ALT 24  --   AST 33  --   GLUCOSE 118* 101*   Lab Results  Component Value Date   TROPONINI <0.03 11/08/2016    Lab Results  Component Value Date   CHOL 262 (A) 03/12/2012   No results found for: HDL No results found for: [facility] Lab Results  Component Value Date   TRIG 197 03/12/2012   No results found for:  CHOLHDL No results found for: LDLDIRECT       Studies: Dg Chest 2 View  Result Date: 11/06/2016 CLINICAL DATA:  83 y/o  F; cough and shortness of breath. EXAM: CHEST  2 VIEW COMPARISON:  None. FINDINGS: Coarse interstitial lung markings consistent with pulmonary fibrosis. No focal consolidation. No pleural effusion or pneumothorax. Low lung volumes. Normal cardiac silhouette given projection and technique. Mild degenerative changes of the thoracic spine. Bones are otherwise unremarkable. IMPRESSION: Pulmonary fibrosis. No focal consolidation, effusion, or pneumothorax. Electronically Signed   By: Mitzi HansenLance  Furusawa-Stratton M.D.   On: 10/30/2016 19:33    ASSESSMENT AND PLAN:   1. Atrial fibrillation: Stable and denies any current symptoms and has not required AV nodal agents. Given her h/o bradycardia, she may not tolerate regardless. She is off of anticoagulation due to severe recurrent nose bleeds.  2. Bradycardia: Not bradycardic at present. Prior monitor with episodes of severe bradycardia as low as 25 bpm and occasional pauses up to 3 seconds.  Due to her severe lung disease she is not a candidate for pacemaker consideration.   3. Essential HTN: Hypotensive.  4. Pulmonary fibrosis: Followed by [facility], appears clinical course has severely deteriorated over the last several months. 03/2016 6 minute walk O2 down to 88% on 15 liters high flow [place]. Appears to be end stage. Needs palliative care.  5. Acute on chronic diastolic hear failure: Some symptomatic improvement. Has severe volume overload with 16 lbs weight gain. If I/O's correct, she has had 500 cc out. Will  continue IV Lasix 20 mg bid as BP will not tolerate a higher dose unfortunately. Continue diuresis as blood pressure allows.  6. Right ventricular enlargement and dysfunction with severe tricuspid regurgitation and pulmonary hypertension: Preliminary echocardiogram view shows significant RV enlargement and dysfunction  with severe tricuspid regurgitation and flattening of the interventricular septum in systole and diastole, indicative of RV volume and pressure overload.     Signed: Prentice Docker, M.D., F.A.C.C.  11/08/2016, 11:48 AM

## 2016-11-08 NOTE — Progress Notes (Signed)
SBP running low- 70-low 90's, O2 sat high 70-80's. Urine output <10 cc/hour. Dr. Irene LimboGoodrich made aware. To unit to assess patient.

## 2016-11-08 NOTE — Progress Notes (Signed)
Cardiology inpatient service to round on today, I had seen in clinic yesterday. I have discontinued her norvasc and losartan due to low bp's and ordered a repeat echo this morning. Further recs per inpatient cardiology service.    Dina RichJonathan Branch MD

## 2016-11-08 NOTE — Progress Notes (Signed)
*  PRELIMINARY RESULTS* Echocardiogram 2D Echocardiogram has been performed.  Stacey DrainWhite, Brailyn Killion J 11/08/2016, 3:02 PM

## 2016-11-08 NOTE — Care Management Note (Addendum)
Case Management Note  Patient Details  Name: Samantha Ryan MRN: [id number] Date of Birth: 1931/05/18  Subjective/Objective:   Adm with Acute CHF. Patient lives with daughter. Has rollator and oxygen PTA. She wears 7-8 L while at rest, up to 10 L with ambulation. [facility] She [place]CP in [place] (Dr. Leandrew KoyanagiBurdine), reports daughter takes her to appointments. She reports daughter has an interview next week with [facility] This would be a great resource for patient. ? If palliative consult would be of interest for patient.    Action/Plan: CM will follow for needs.   Expected Discharge Date:     11/10/2016             Expected Discharge Plan:  Home/Self Care  In-House Referral:     Discharge planning Services  CM Consult  Post Acute Care Choice:    Choice offered to:     DME Arranged:    DME Agency:     HH Arranged:    HH Agency:     Status of Service:  In process, will continue to follow  If discussed at Long Length of Stay Meetings, dates discussed:    Additional Comments:  [name], [name], RN/ 11/08/2016, 3:18 PM

## 2016-11-08 NOTE — Progress Notes (Signed)
Spoke with Bonita QuinLinda, patient's daughter regarding pt's home medication: Acetylcysteine, that per pharmacy is not carried at hospital. She said she would try to find it and bring it in.

## 2016-11-08 NOTE — Progress Notes (Addendum)
PROGRESS NOTE  [place] WLN:[id number] DOB: 08/13/1931 DOA: 11/14/2016 PCP: Curlene Labrum, MD  Brief Narrative: 83 year old on PMH idiopathic pulmonary fibrosis on 8-10 liters oxygen at home, atrial fibrillation not on anticoagulation, seen by cardiologist in clinic and sent to the emergency department for volume overload. Admitted for acute CHF.  Assessment/Plan 1. Volume overload, lower extremity edema, suspected acute CHF unknown type. 2. Acute on chronic hypoxic respiratory failure secondary to above. 3. Idiopathic pulmonary fibrosis, on 8-10 liters at home, typical SPO2 80s. Followed at [facility], consideration was given to hospice in the past. 4. Atrial fibrillation. Not a candidate for anticoagulation secondary to severe epistaxis. 5. Diabetes mellitus   Blood pressure is borderline with systolic 74-08X, suspect sensitive to Lasix. Still has significant volume overload. Oxygen saturations appear to be at baseline and pulmonary fibrosis is complicating issue but does not appear to be a driving force at this time.  Follow cardiology recommendations, continue diuresis as blood pressure allows.  Patient has been living at home with her daughter which has met her needs. Consider palliative involvement if the patient is open to this.  ADDENDUM 1800 Discussed with daughter Vaughan Basta at bedside (pt lives with her). Pt has declined over last several months and activity is severely limited. Discussed RV failure, hypotension, hypoxia, prognosis poor, may not survive this hospitalization. Diuresis limited by hypotension. Will involve PMT 3/16. For now continue current management.  DVT prophylaxis: enoxaparin Code Status: DNR Family Communication: none Disposition Plan: pending    Murray Hodgkins, MD  [facility] Direct contact: [phone] --Via amion app OR  --[email or web address]; password [email or web address]  7PM-7AM contact night coverage as above 11/08/2016, 11:12 AM  LOS: 1 day    Consultants:  Cardiology  Procedures:  Echo pending  Antimicrobials:    Interval history/Subjective: Complains of back pain. Breathing about as usual. Still has significant lower extremity edema, but seems to be improving.  Objective: Vitals:   11/08/16 0759 11/08/16 0800 11/08/16 0900 11/08/16 1000  BP:  (!) 86/57 (!) 78/68 98/83  Pulse:  98 (!) 104 94  Resp:  20 (!) 27 14  Temp: 97.7 F (36.5 C)     TempSrc: Axillary     SpO2:  (!) 62% (!) 86% 97%  Weight:      Height:        Intake/Output Summary (Last 24 hours) at 11/08/16 1112 Last data filed at 11/08/16 1034  Gross per 24 hour  Intake              503 ml  Output              800 ml  Net             -297 ml     Filed Weights   11/24/2016 1724 11/18/2016 2109 11/08/16 0500  Weight: 82.1 kg (181 lb) 81.4 kg (179 lb 7.3 oz) 81.4 kg (179 lb 7.3 oz)    Exam:    Constitutional: Appears chronically ill, acutely ill but not toxic.  Respiratory: Increased respiratory effort, no frank wheezes, rales or rhonchi. Able to speak in short sentences but fatigues quickly. SpO2 predominantly 80s.  Cardiovascular: Irregular, normal rate. No murmur, rub or gallop. 3+ bilateral lower extremity edema. Telemetry atrial fibrillation.  Psychiatric: Grossly normal mood and affect. Speech fluent and appropriate.   I have personally reviewed following labs and imaging studies:  BUN, creatinine without significant change 36/1.47. CO2 33. Troponins negative. TSH elevated    Scheduled Meds: . Acetylcysteine  1  capsule Oral TID  . aspirin EC  81 mg Oral Daily  . enoxaparin (LOVENOX) injection  30 mg Subcutaneous Q24H  . furosemide  40 mg Intravenous Q12H  . insulin aspart  0-9 Units Subcutaneous TID WC  . levothyroxine  25 mcg Oral QAC breakfast  . magnesium oxide  200 mg Oral Daily  . sodium chloride flush  3 mL Intravenous Q12H  . vitamin C  1,000 mg Oral Daily   Continuous Infusions:  Principal Problem:   Acute on  chronic respiratory failure with hypoxia (HCC) Active Problems:   Diabetes mellitus (HCC)   Atrial fibrillation (HCC)   Pulmonary fibrosis (HCC)   Acute CHF (congestive heart failure) ([place])   LOS: 1 day

## 2016-11-09 DIAGNOSIS — I5033 Acute on chronic diastolic (congestive) heart failure: Secondary | ICD-10-CM

## 2016-11-09 DIAGNOSIS — I959 Hypotension, unspecified: Secondary | ICD-10-CM

## 2016-11-09 DIAGNOSIS — I50811 Acute right heart failure: Secondary | ICD-10-CM

## 2016-11-09 DIAGNOSIS — R600 Localized edema: Secondary | ICD-10-CM

## 2016-11-09 DIAGNOSIS — I272 Pulmonary hypertension, unspecified: Secondary | ICD-10-CM

## 2016-11-09 DIAGNOSIS — I519 Heart disease, unspecified: Secondary | ICD-10-CM

## 2016-11-09 DIAGNOSIS — I517 Cardiomegaly: Secondary | ICD-10-CM

## 2016-11-09 DIAGNOSIS — I5031 Acute diastolic (congestive) heart failure: Secondary | ICD-10-CM

## 2016-11-09 DIAGNOSIS — I5030 Unspecified diastolic (congestive) heart failure: Secondary | ICD-10-CM

## 2016-11-09 DIAGNOSIS — Z515 Encounter for palliative care: Secondary | ICD-10-CM

## 2016-11-09 DIAGNOSIS — I071 Rheumatic tricuspid insufficiency: Secondary | ICD-10-CM

## 2016-11-09 LAB — GLUCOSE, CAPILLARY: Glucose-Capillary: 94 mg/dL (ref 65–99)

## 2016-11-09 LAB — BASIC METABOLIC PANEL
Anion gap: 12 (ref 5–15)
BUN: 40 mg/dL — AB (ref 6–20)
CALCIUM: 8.3 mg/dL — AB (ref 8.9–10.3)
CO2: 29 mmol/L (ref 22–32)
CREATININE: 2.08 mg/dL — AB (ref 0.44–1.00)
Chloride: 96 mmol/L — ABNORMAL LOW (ref 101–111)
GFR calc Af Amer: 24 mL/min — ABNORMAL LOW (ref >60)
GFR, EST NON AFRICAN AMERICAN: 21 mL/min — AB (ref >60)
Glucose, Bld: 81 mg/dL (ref 65–99)
Potassium: 3.8 mmol/L (ref 3.5–5.1)
SODIUM: 137 mmol/L (ref 135–145)

## 2016-11-09 MED ORDER — POLYVINYL ALCOHOL 1.4 % OP SOLN
1.0000 [drp] | OPHTHALMIC | Status: DC | PRN
  Filled 2016-11-09: qty 15

## 2016-11-12 ENCOUNTER — Telehealth: Payer: Self-pay | Admitting: Cardiology

## 2016-11-12 NOTE — Telephone Encounter (Signed)
Pt daughter had multiple questions regarding pt last few hours regarding BP/ IV diuretics - per ED notes gave daughter as much information as possible within my limits pt daughter very appreciative of time taken to talk - pt daughter still has a few questions that she would like to discuss with Dr Wyline MoodBranch

## 2016-11-12 NOTE — Telephone Encounter (Signed)
Bonita QuinLinda (daughter) called wanting to know if she could speak with Dr. Wyline MoodBranch in regards to her mother and her blood pressure.  Patient expired on 10/30/2016.  Telephone # [phone].

## 2016-11-25 NOTE — Discharge Summary (Addendum)
Death Summary  [place] UJW:[id number] DOB: 02/03/1931 DOA: 11/04/2016  PCP: Juliette AlcideBURDINE,STEVEN E, MD   Admit date: 11/04/2016 Date of Death: 11/03/16  Final Diagnoses:  1. Acute right-sided heart failure 2. Severe tricuspid regurgitation 3. Acute on chronic diastolic congestive heart failure 4. Acute on chronic hypoxic respiratory failure 5. End-stage idiopathic pulmonary fibrosis 6. AKI 7. Atrial fibrillation  History of present illness:  83 year old on PMH idiopathic pulmonary fibrosis on 8-10 liters oxygen at home, atrial fibrillation not on anticoagulation, seen by cardiologist in clinic and sent to the emergency department for volume overload. Admitted for acute CHF.  Hospital Course:  Patient was admitted and treated with IV diuresis and seen in consultation with cardiology. Echocardiogram revealed severe right sided systolic dysfunction, volume overload, severe tricuspid regurgitation. These issues secondary to idiopathic pulmonary fibrosis on high oxygen requirement at home. Family at bedside reported poor quality of life with decline over the last several months. Patient became hypotensive and no further diuresis could be pursued. Care was discussed with family who declined more aggressive measures. Patient alsodeclined more aggressive measures. Given hypotension, inability to diurese and end-stage idiopathic pulmonary fibrosis, in keeping with the patient's wishes, she was made comfort care and passed away 3/16.    Time: 45 minutes  Signed:  Brendia Sacksaniel Shaylene Paganelli, MD  [facility] 11/03/16, 1:12 PM

## 2016-11-25 NOTE — Progress Notes (Signed)
PROGRESS NOTE  [place] ZOX:[id number] DOB: 1931-08-15 DOA: 2016/10/08 PCP: Juliette AlcideBURDINE,STEVEN E, MD  Brief Narrative: 83 year old on PMH idiopathic pulmonary fibrosis on 8-10 liters oxygen at home, atrial fibrillation not on anticoagulation, seen by cardiologist in clinic and sent to the emergency department for volume overload. Admitted for acute CHF.  Assessment/Plan 1. Acute on chronic diastolic congestive heart failure, acute right sided heart failure with severe tricuspid regurgitation, right ventricular systolic dysfunction, right ventricular enlargement, pulmonary hypertension, massive volume overload. 2. Severe hypotension secondary to above 3. Acute on chronic hypoxic respiratory failure stable on facemask. 4. End-stage Idiopathic pulmonary fibrosis, on 8-10 liters at home, typical SPO2 80s. [facility], consideration was given to hospice in the past. 5. Atrial fibrillation. Not a candidate for anticoagulation secondary to severe epistaxis. No further treatment indicated. 6. Diabetes mellitus, no further treatment.   Progressively worsening, hypotensive, unable to diurese, worsening renal function, discussed with cardiology, vasopressors unlikely to provide even temporary benefit and will not change outcome and thus are not recommended. Discussed with patient who wants no escalation of care.  Prognosis grim, discussed with daughter by telephone this morning, she is interested in taking the patient home with hospice if possible, proceeding with comfort measures.   Monitor this morning but given her declining status suspect a today.   DVT prophylaxis: None Code Status: DNR, full comfort measures Family Communication:  Disposition Plan: Anticipate in-hospital death   Brendia Sacksaniel Acelynn Dejonge, MD  [facility] Direct contact: [phone] --Via amion app OR  --[email or web address]; password [email or web address]  7PM-7AM contact night coverage as above 11/15/2016, 8:57 AM  LOS: 2 days    Consultants:  Cardiology  Procedures:  Echo  Study Conclusions  - Left ventricle: The cavity size was normal. Wall thickness was   increased in a pattern of mild LVH. Systolic function was at the   lower limits of normal. The estimated ejection fraction was 50%.   The study was not technically sufficient to allow evaluation of   LV diastolic dysfunction due to atrial fibrillation. - Ventricular septum: The contour showed diastolic flattening and   systolic flattening. These changes are consistent with RV volume   and pressure overload. - Aortic valve: Trileaflet; mildly thickened, mildly calcified   leaflets. - Mitral valve: Moderately thickened leaflets . - Right ventricle: The cavity size was severely dilated. Systolic   function was severely reduced. - Right atrium: The atrium was massively dilated. - Atrial septum: The septum bowed from right to left, consistent   with increased right atrial pressure. No defect or patent foramen   ovale was identified. - Tricuspid valve: There was severe regurgitation. - Pulmonic valve: There was mild regurgitation. - Pulmonary arteries: PA peak pressure: 50 mm Hg (S). - Inferior vena cava: The vessel was dilated. The respirophasic   diameter changes were blunted (< 50%), consistent with elevated   central venous pressure. Estimated CVP 15 mmHg. - Pericardium, extracardiac: Small pericardial effusion present.   Features were not consistent with tamponade physiology.  Antimicrobials:    Interval history/Subjective: More hypotensive overnight. Nauseous.  Objective: Vitals:   11/08/2016 0400 11/11/2016 0500 10/28/2016 0800 10/27/2016 0827  BP: (!) 57/44 (!) 68/50    Pulse: 100 96    Resp: 18 14    Temp: 97.4 F (36.3 C)  (!) 96.7 F (35.9 C) (!) 96.8 F (36 C)  TempSrc: Axillary  Axillary Axillary  SpO2: 99% 97%    Weight:  83.5 kg (184 lb 1.4 oz)    Height:  Intake/Output Summary (Last 24 hours) at 10/29/2016 0857 Last  data filed at 11/08/16 2200  Gross per 24 hour  Intake              803 ml  Output              125 ml  Net              678 ml     Filed Weights   11-23-2016 2109 11/08/16 0500 11/14/2016 0500  Weight: 81.4 kg (179 lb 7.3 oz) 81.4 kg (179 lb 7.3 oz) 83.5 kg (184 lb 1.4 oz)    Exam: Constitutional: Appears critically ill. Appears to be dying.  Respiratory: Poor air movement. No frank wheezes, rales or rhonchi. Moderate to severe increased respiratory effort. Able to speak only in very short sentences.   Cardiovascular irregular, normal rate. No murmur, rub or gallop. 3+ bilateral lower extremity edema.  Psychiatric: Alert, speech fluent and clear. Able to participate in conversation.   I have personally reviewed following labs and imaging studies:  Very poor urine output  BUN 40, creatinine worse, 2.08  Scheduled Meds: . ondansetron (ZOFRAN) IV  4 mg Intravenous Q6H  . sodium chloride flush  3 mL Intravenous Q12H   Continuous Infusions:  Principal Problem:   Acute on chronic respiratory failure with hypoxia (HCC) Active Problems:   Diabetes mellitus (HCC)   Atrial fibrillation (HCC)   Pulmonary fibrosis (HCC)   Acute CHF (congestive heart failure) (HCC)   LOS: 2 days

## 2016-11-25 NOTE — Progress Notes (Signed)
Progress Note  Patient Name: Samantha Ryan Date of Encounter: 11/08/2016  Primary Cardiologist: Wyline MoodBranch MD  Subjective   Complains of dizziness and nausea. Markedly hypotensive. No diuretics given last night or this morning.  Inpatient Medications    Scheduled Meds: . ondansetron (ZOFRAN) IV  4 mg Intravenous Q6H  . sodium chloride flush  3 mL Intravenous Q12H   Continuous Infusions:  PRN Meds: sodium chloride, acetaminophen, sodium chloride flush   Vital Signs    Vitals:   11/18/2016 0500 11/10/2016 0700 11/19/2016 0800 11/23/2016 0827  BP: (!) 68/50 (!) 61/51 (!) 56/45   Pulse: 96 95 87   Resp: 14 14 (!) 21   Temp:   (!) 96.7 F (35.9 C) (!) 96.8 F (36 C)  TempSrc:   Axillary Axillary  SpO2: 97% 97% 94%   Weight: 184 lb 1.4 oz (83.5 kg)     Height:        Intake/Output Summary (Last 24 hours) at 10/30/2016 0908 Last data filed at 11/08/16 2200  Gross per 24 hour  Intake              803 ml  Output              125 ml  Net              678 ml   Filed Weights   01/31/17 2109 11/08/16 0500 11/02/2016 0500  Weight: 179 lb 7.3 oz (81.4 kg) 179 lb 7.3 oz (81.4 kg) 184 lb 1.4 oz (83.5 kg)    Telemetry    Atrial fibrillation, PVC's, NSVT - Personally Reviewed  ECG     - Personally Reviewed  Physical Exam   General: NAD, ill appearing Neck: +JVD to angle of jaw (unchanged) Lungs: Bilateral crackles. CV: Nondisplaced PMI. Regular rate and irregular rhythm, normal S1/S2, no S3, 3/6 pansystolic murmur along left sternal border. 2-3+ pitting lower extremity edema b/l above the knees (unchanged).   Abdomen: Soft, nontender, no distention.  Skin: Intact without lesions or rashes.  Neurologic: Alert and oriented, drowsy.  Psych: Flat affect. HEENT: Normal.   Labs    Chemistry Recent Labs Lab 01/31/17 1738 11/08/16 0322 11/15/2016 0429  NA 132* 135 137  K 3.6 3.9 3.8  CL 91* 93* 96*  CO2 30 33* 29  GLUCOSE 118* 101* 81  BUN 37* 36* 40*  CREATININE 1.51* 1.47*  2.08*  CALCIUM 8.4* 8.4* 8.3*  PROT 6.4*  --   --   ALBUMIN 2.9*  --   --   AST 33  --   --   ALT 24  --   --   ALKPHOS 149*  --   --   BILITOT 2.1*  --   --   GFRNONAA 31* 32* 21*  GFRAA 36* 37* 24*  ANIONGAP 11 9 12      Hematology Recent Labs Lab 01/31/17 1738  WBC 8.6  RBC 5.10  HGB 14.5  HCT 45.0  MCV 88.2  MCH 28.4  MCHC 32.2  RDW 17.9*  PLT 143*    Cardiac Enzymes Recent Labs Lab 01/31/17 1738 01/31/17 2129 11/08/16 0322 11/08/16 0859  TROPONINI <0.03 <0.03 <0.03 <0.03   No results for input(s): TROPIPOC in the last 168 hours.   BNP Recent Labs Lab 01/31/17 1738  BNP 898.0*     DDimer No results for input(s): DDIMER in the last 168 hours.   Radiology    Dg Chest 2 View  Result Date: 06-11-17 CLINICAL DATA:  82 y/o  F; cough and shortness of breath. EXAM: CHEST  2 VIEW COMPARISON:  None. FINDINGS: Coarse interstitial lung markings consistent with pulmonary fibrosis. No focal consolidation. No pleural effusion or pneumothorax. Low lung volumes. Normal cardiac silhouette given projection and technique. Mild degenerative changes of the thoracic spine. Bones are otherwise unremarkable. IMPRESSION: Pulmonary fibrosis. No focal consolidation, effusion, or pneumothorax. Electronically Signed   By: Mitzi Hansen M.D.   On: 11/01/2016 19:33    Cardiac Studies   Echocardiogram (11/08/16)  - Left ventricle: The cavity size was normal. Wall thickness was   increased in a pattern of mild LVH. Systolic function was at the   lower limits of normal. The estimated ejection fraction was 50%.   The study was not technically sufficient to allow evaluation of   LV diastolic dysfunction due to atrial fibrillation. - Ventricular septum: The contour showed diastolic flattening and   systolic flattening. These changes are consistent with RV volume   and pressure overload. - Aortic valve: Trileaflet; mildly thickened, mildly calcified   leaflets. - Mitral  valve: Moderately thickened leaflets . - Right ventricle: The cavity size was severely dilated. Systolic   function was severely reduced. - Right atrium: The atrium was massively dilated. - Atrial septum: The septum bowed from right to left, consistent   with increased right atrial pressure. No defect or patent foramen   ovale was identified. - Tricuspid valve: There was severe regurgitation. - Pulmonic valve: There was mild regurgitation. - Pulmonary arteries: PA peak pressure: 50 mm Hg (S). - Inferior vena cava: The vessel was dilated. The respirophasic   diameter changes were blunted (< 50%), consistent with elevated   central venous pressure. Estimated CVP 15 mmHg. - Pericardium, extracardiac: Small pericardial effusion present.   Features were not consistent with tamponade physiology.   Patient Profile     82 y.o. female who is essentially wheelchair bound with severe pulmonary fibrosis followed at [facility], nose bleeds, bradycardia with pauses, and atrial fibrillation who was evaluated by Dr. Wyline Mood in the clinic on 3/14 and he recommended hospitalization for IV diuresis due to severe lower extremity edema with 16 lb weight gain and acute on chronic diastolic heart failure.  Assessment & Plan    1. Atrial fibrillation: Stable and denies any current symptoms and has not required AV nodal agents. Given her h/o bradycardia, she may not tolerate regardless. She is off of anticoagulation due to severe recurrent nose bleeds.  2. Bradycardia: Not bradycardic at present. Prior monitor with episodes of severe bradycardia as low as 25 bpm and occasional pauses up to 3 seconds.  Due to her severe lung disease she is not a candidate for pacemaker consideration.   3. Essential HTN: Markedly hypotensive, thus unable to diurese.  4. Pulmonary fibrosis: Followed by [facility], appears clinical course has severely deteriorated over the last several months. 03/2016 6 minute walk O2 down to 88%  on 15 liters high flow [place]. Appears to be end stage. Needs comfort care at this point.  5. Acute on chronic diastolic hear failure: Markedly hypotensive, thus unable to diurese last night or this morning. Has severe volume overload with 16 lbs weight gain. If I/O's correct, she had had 500 cc out yesterday but now with minimal urine output.  I spoke with Dr. Irene Limbo. The plan is to make her comfort care.  6. Right ventricular enlargement and dysfunction with severe tricuspid regurgitation and pulmonary hypertension: Echocardiogram shows severe RV and RA enlargement and severe  RV dysfunction with severe tricuspid regurgitation and flattening of the interventricular septum in systole and diastole, indicative of RV volume and pressure overload.   Dispo: I spoke with Dr. Irene Limbo. The plan is to make her comfort care.   Signed, Prentice Docker, MD  10/26/2016, 9:08 AM

## 2016-11-25 DEATH — deceased

## 2017-05-03 IMAGING — DX DG CHEST 2V
2 series · 2 of 2 positions shown · non-contrast
Comparison: None.

CLINICAL DATA: 85 y/o  F; cough and shortness of breath.

EXAM:
CHEST  2 VIEW

[chest lat]
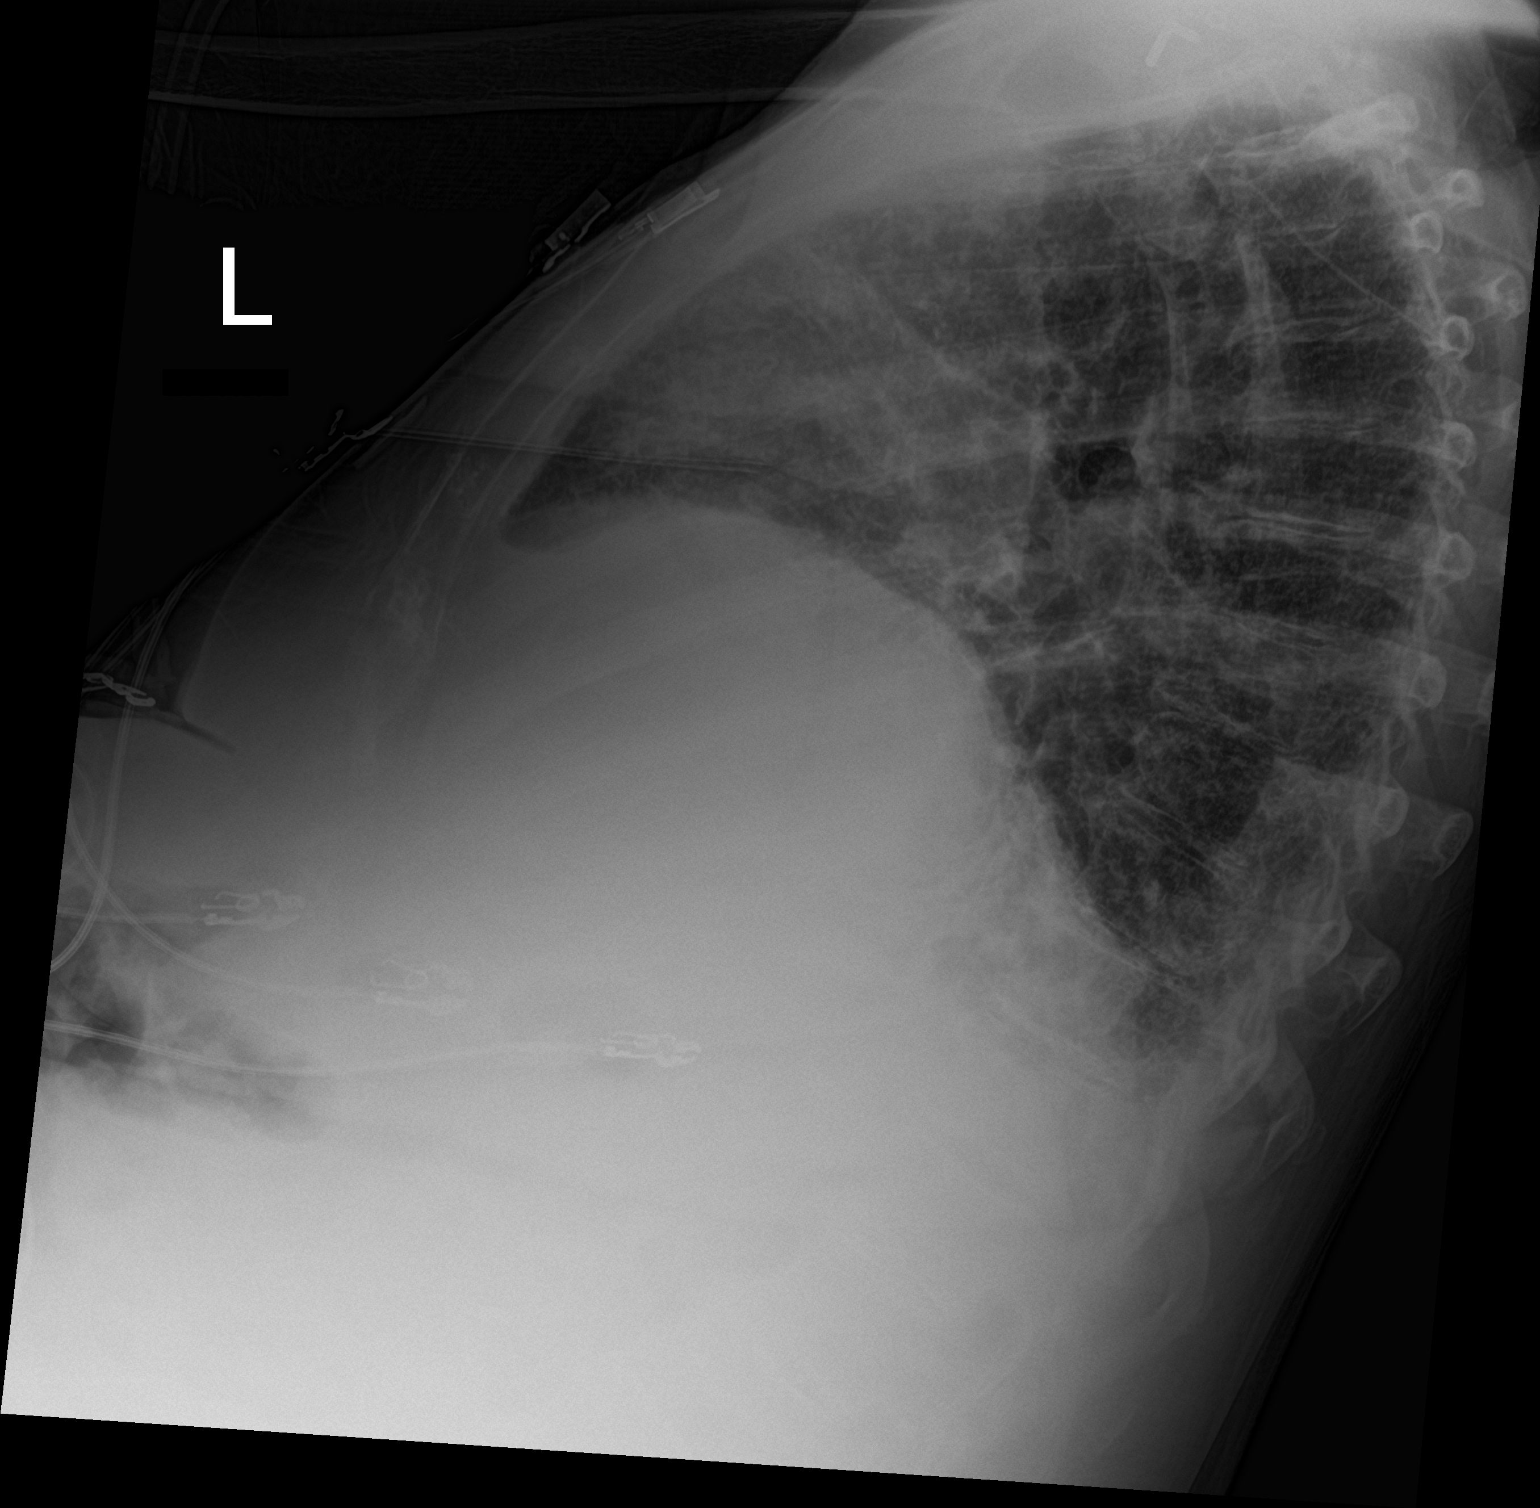

[chest ap]
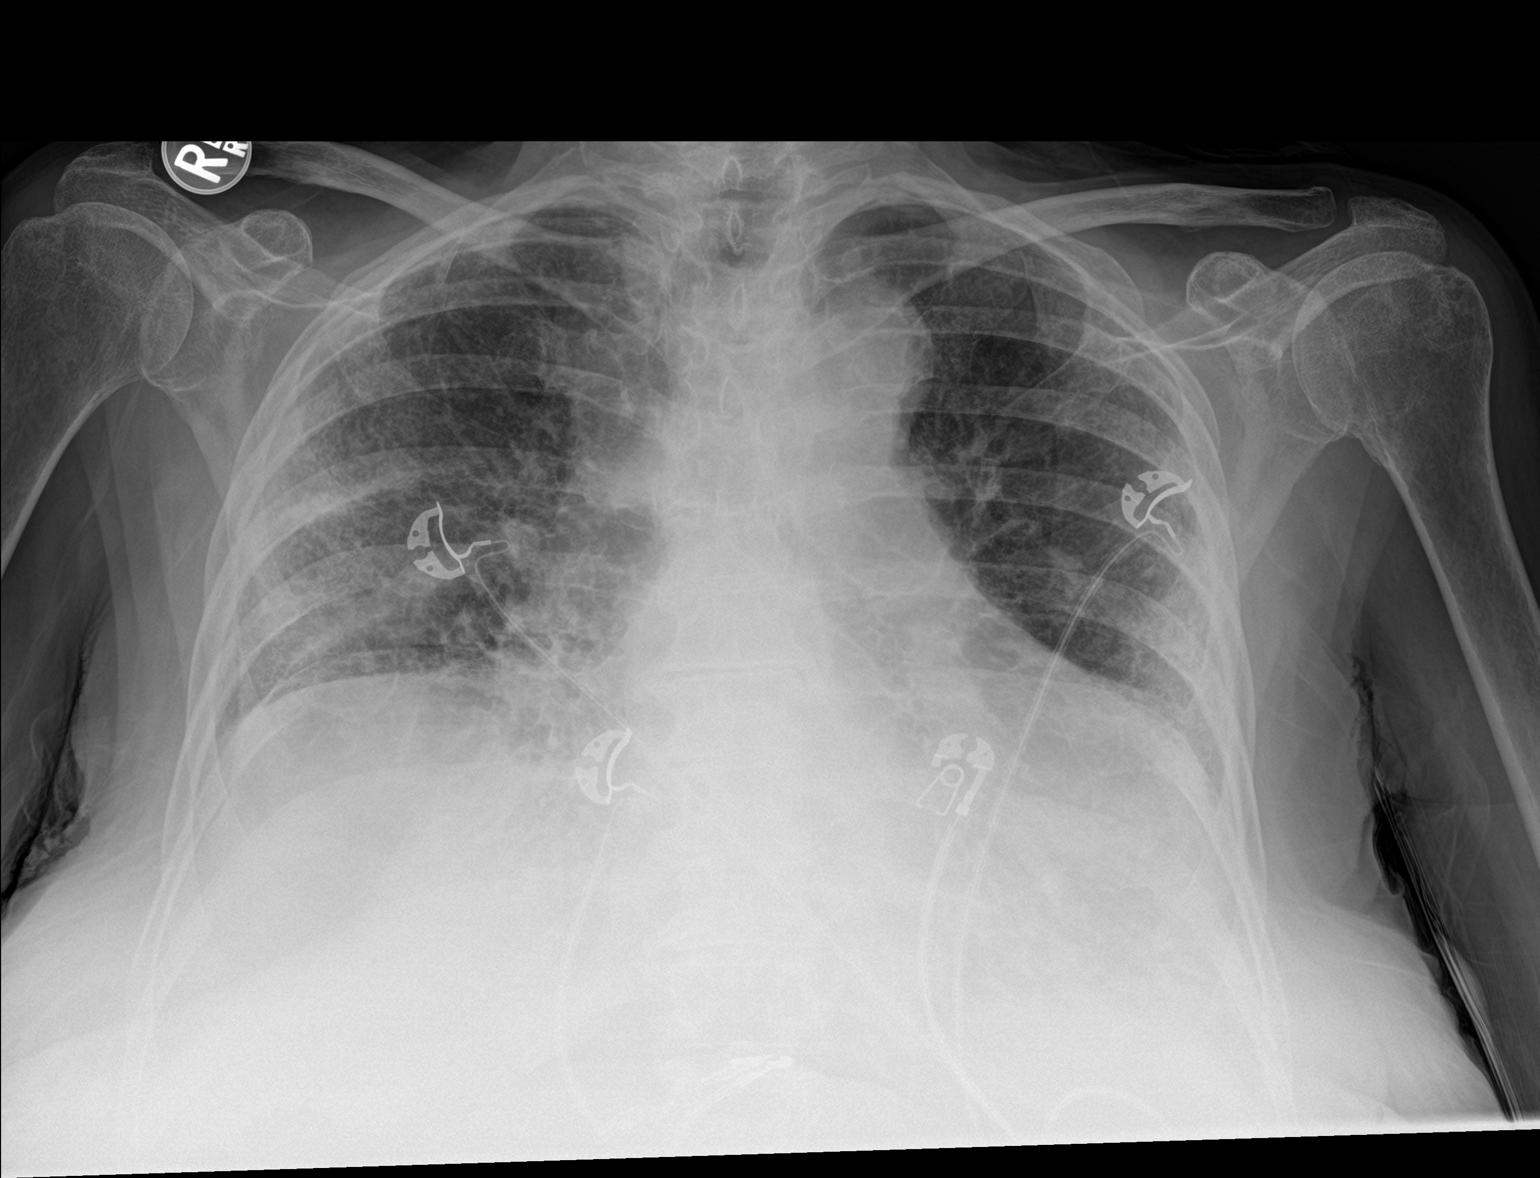

[2 of 2 positions shown; findings below may reference images not displayed]

FINDINGS: Coarse interstitial lung markings consistent with pulmonary
fibrosis. No focal consolidation. No pleural effusion or
pneumothorax. Low lung volumes. Normal cardiac silhouette given
projection and technique. Mild degenerative changes of the thoracic
spine. Bones are otherwise unremarkable.
IMPRESSION: Pulmonary fibrosis. No focal consolidation, effusion, or
pneumothorax.

By: Qomandan Tiger M.D.
